# Patient Record
Sex: Male | Born: 1935 | Race: White | Hispanic: No | Smoking: Former smoker
Health system: Southern US, Community
[De-identification: ages and names within clinical notes are randomized; demographics above are authoritative.]

## PROBLEM LIST (undated history)

## (undated) DIAGNOSIS — E079 Disorder of thyroid, unspecified: Secondary | ICD-10-CM

## (undated) DIAGNOSIS — S91302A Unspecified open wound, left foot, initial encounter: Secondary | ICD-10-CM

## (undated) DIAGNOSIS — IMO0001 Reserved for inherently not codable concepts without codable children: Secondary | ICD-10-CM

## (undated) DIAGNOSIS — D649 Anemia, unspecified: Secondary | ICD-10-CM

## (undated) DIAGNOSIS — R198 Other specified symptoms and signs involving the digestive system and abdomen: Secondary | ICD-10-CM

## (undated) DIAGNOSIS — I10 Essential (primary) hypertension: Secondary | ICD-10-CM

## (undated) DIAGNOSIS — H9190 Unspecified hearing loss, unspecified ear: Secondary | ICD-10-CM

## (undated) DIAGNOSIS — R6889 Other general symptoms and signs: Secondary | ICD-10-CM

## (undated) DIAGNOSIS — H3530 Unspecified macular degeneration: Secondary | ICD-10-CM

## (undated) HISTORY — DX: Reserved for inherently not codable concepts without codable children: IMO0001

## (undated) HISTORY — DX: Disorder of thyroid, unspecified: E07.9

## (undated) HISTORY — DX: Unspecified macular degeneration: H35.30

## (undated) HISTORY — DX: Unspecified open wound, left foot, initial encounter: S91.302A

## (undated) HISTORY — DX: Essential (primary) hypertension: I10

## (undated) HISTORY — PX: CATARACT EXTRACTION: SUR2

## (undated) HISTORY — PX: OTHER SURGICAL HISTORY: SHX169

## (undated) HISTORY — DX: Other general symptoms and signs: R68.89

## (undated) HISTORY — PX: CORONARY ARTERY BYPASS GRAFT: SHX141

## (undated) HISTORY — DX: Other specified symptoms and signs involving the digestive system and abdomen: R19.8

## (undated) HISTORY — DX: Unspecified hearing loss, unspecified ear: H91.90

## (undated) HISTORY — DX: Anemia, unspecified: D64.9

## (undated) HISTORY — PX: ESOPHAGEAL DILATION: SHX303

---

## 1994-01-20 HISTORY — PX: LUMBAR LAMINECTOMY: SHX95

## 1996-01-21 HISTORY — PX: INGUINAL HERNIA REPAIR: SUR1180

## 1997-05-26 ENCOUNTER — Encounter: Admission: RE | Admit: 1997-05-26 | Discharge: 1997-08-24 | Payer: Self-pay | Admitting: Internal Medicine

## 1999-01-22 ENCOUNTER — Ambulatory Visit (HOSPITAL_COMMUNITY): Admission: RE | Admit: 1999-01-22 | Discharge: 1999-01-23 | Payer: Self-pay | Admitting: Cardiovascular Disease

## 1999-04-13 ENCOUNTER — Inpatient Hospital Stay (HOSPITAL_COMMUNITY): Admission: RE | Admit: 1999-04-13 | Discharge: 1999-04-21 | Payer: Self-pay | Admitting: Cardiology

## 1999-04-13 ENCOUNTER — Encounter: Payer: Self-pay | Admitting: Cardiothoracic Surgery

## 1999-04-16 ENCOUNTER — Encounter: Payer: Self-pay | Admitting: Cardiothoracic Surgery

## 1999-04-17 ENCOUNTER — Encounter: Payer: Self-pay | Admitting: Cardiothoracic Surgery

## 1999-04-18 ENCOUNTER — Encounter: Payer: Self-pay | Admitting: Cardiothoracic Surgery

## 1999-04-19 ENCOUNTER — Encounter: Payer: Self-pay | Admitting: Cardiothoracic Surgery

## 2004-04-10 ENCOUNTER — Ambulatory Visit: Payer: Self-pay | Admitting: Internal Medicine

## 2004-07-12 ENCOUNTER — Ambulatory Visit: Payer: Self-pay | Admitting: Internal Medicine

## 2004-07-16 ENCOUNTER — Ambulatory Visit: Payer: Self-pay | Admitting: Internal Medicine

## 2004-10-18 ENCOUNTER — Ambulatory Visit: Payer: Self-pay | Admitting: Internal Medicine

## 2004-11-07 ENCOUNTER — Ambulatory Visit (HOSPITAL_COMMUNITY): Admission: RE | Admit: 2004-11-07 | Discharge: 2004-11-08 | Payer: Self-pay | Admitting: Otolaryngology

## 2005-01-03 ENCOUNTER — Ambulatory Visit: Payer: Self-pay | Admitting: Internal Medicine

## 2005-04-15 ENCOUNTER — Ambulatory Visit: Payer: Self-pay | Admitting: Internal Medicine

## 2005-07-21 ENCOUNTER — Ambulatory Visit: Payer: Self-pay | Admitting: Internal Medicine

## 2005-11-24 ENCOUNTER — Ambulatory Visit: Payer: Self-pay | Admitting: Internal Medicine

## 2005-12-08 ENCOUNTER — Ambulatory Visit: Payer: Self-pay | Admitting: Internal Medicine

## 2006-05-25 ENCOUNTER — Ambulatory Visit: Payer: Self-pay | Admitting: Internal Medicine

## 2006-05-25 LAB — CONVERTED CEMR LAB
ALT: 19 units/L (ref 0–40)
AST: 24 units/L (ref 0–37)
BUN: 12 mg/dL (ref 6–23)
Cholesterol: 122 mg/dL (ref 0–200)
Creatinine, Ser: 1.1 mg/dL (ref 0.4–1.5)
HDL: 54.8 mg/dL (ref >39.0)
Hgb A1c MFr Bld: 6.1 % — ABNORMAL HIGH (ref 4.6–6.0)
LDL Cholesterol: 58 mg/dL (ref 0–99)
PSA: 0.46 ng/mL (ref 0.10–4.00)
Potassium: 4.7 meq/L (ref 3.5–5.1)
Total CHOL/HDL Ratio: 2.2
Triglycerides: 46 mg/dL (ref 0–149)
VLDL: 9 mg/dL (ref 0–40)

## 2006-06-02 ENCOUNTER — Encounter: Payer: Self-pay | Admitting: Internal Medicine

## 2006-06-02 ENCOUNTER — Ambulatory Visit: Payer: Self-pay | Admitting: Internal Medicine

## 2006-06-16 ENCOUNTER — Ambulatory Visit: Payer: Self-pay | Admitting: Internal Medicine

## 2006-06-18 DIAGNOSIS — I10 Essential (primary) hypertension: Secondary | ICD-10-CM | POA: Insufficient documentation

## 2006-07-07 ENCOUNTER — Ambulatory Visit: Payer: Self-pay | Admitting: Gastroenterology

## 2006-07-07 LAB — CONVERTED CEMR LAB
ALT: 19 units/L (ref 0–40)
Albumin: 3.8 g/dL (ref 3.5–5.2)
Alkaline Phosphatase: 53 units/L (ref 39–117)
BUN: 8 mg/dL (ref 6–23)
Basophils Absolute: 0 10*3/uL (ref 0.0–0.1)
Basophils Relative: 0.5 % (ref 0.0–1.0)
Calcium: 8.6 mg/dL (ref 8.4–10.5)
Creatinine, Ser: 0.9 mg/dL (ref 0.4–1.5)
GFR calc Af Amer: 105 mL/min
Hemoglobin: 11.3 g/dL — ABNORMAL LOW (ref 13.0–17.0)
MCHC: 33.4 g/dL (ref 30.0–36.0)
Monocytes Absolute: 0.4 10*3/uL (ref 0.2–0.7)
Monocytes Relative: 9.6 % (ref 3.0–11.0)
Platelets: 154 10*3/uL (ref 150–400)
Potassium: 4.5 meq/L (ref 3.5–5.1)
RDW: 13.6 % (ref 11.5–14.6)
Total Bilirubin: 0.6 mg/dL (ref 0.3–1.2)

## 2006-07-20 ENCOUNTER — Ambulatory Visit: Payer: Self-pay | Admitting: Gastroenterology

## 2006-07-20 LAB — CONVERTED CEMR LAB
Ferritin: 27.1 ng/mL
Folate: 18.3 ng/mL
Iron: 79 ug/dL
Saturation Ratios: 22.9 %
Transferrin: 246.7 mg/dL
Vitamin B-12: 387 pg/mL

## 2006-08-12 ENCOUNTER — Encounter: Payer: Self-pay | Admitting: Internal Medicine

## 2006-08-12 ENCOUNTER — Ambulatory Visit: Payer: Self-pay | Admitting: Gastroenterology

## 2006-08-12 DIAGNOSIS — K2990 Gastroduodenitis, unspecified, without bleeding: Secondary | ICD-10-CM

## 2006-08-12 DIAGNOSIS — K2970 Gastritis, unspecified, without bleeding: Secondary | ICD-10-CM | POA: Insufficient documentation

## 2006-08-12 DIAGNOSIS — K449 Diaphragmatic hernia without obstruction or gangrene: Secondary | ICD-10-CM | POA: Insufficient documentation

## 2006-08-19 ENCOUNTER — Ambulatory Visit: Payer: Self-pay | Admitting: Internal Medicine

## 2006-08-28 ENCOUNTER — Ambulatory Visit: Payer: Self-pay | Admitting: Internal Medicine

## 2006-09-17 ENCOUNTER — Ambulatory Visit: Payer: Self-pay | Admitting: Gastroenterology

## 2007-01-21 HISTORY — PX: COLONOSCOPY W/ POLYPECTOMY: SHX1380

## 2007-03-18 ENCOUNTER — Telehealth (INDEPENDENT_AMBULATORY_CARE_PROVIDER_SITE_OTHER): Payer: Self-pay | Admitting: *Deleted

## 2007-04-16 DIAGNOSIS — I7090 Unspecified atherosclerosis: Secondary | ICD-10-CM | POA: Insufficient documentation

## 2007-04-16 DIAGNOSIS — Z8711 Personal history of peptic ulcer disease: Secondary | ICD-10-CM | POA: Insufficient documentation

## 2007-04-16 DIAGNOSIS — K219 Gastro-esophageal reflux disease without esophagitis: Secondary | ICD-10-CM | POA: Insufficient documentation

## 2007-04-16 DIAGNOSIS — M129 Arthropathy, unspecified: Secondary | ICD-10-CM | POA: Insufficient documentation

## 2007-05-10 ENCOUNTER — Encounter: Payer: Self-pay | Admitting: Internal Medicine

## 2007-05-10 ENCOUNTER — Ambulatory Visit: Payer: Self-pay | Admitting: Internal Medicine

## 2007-05-10 DIAGNOSIS — E785 Hyperlipidemia, unspecified: Secondary | ICD-10-CM | POA: Insufficient documentation

## 2007-05-10 LAB — CONVERTED CEMR LAB
ALT: 13 units/L (ref 0–53)
AST: 22 units/L (ref 0–37)
Basophils Relative: 1.1 % — ABNORMAL HIGH (ref 0.0–1.0)
Creatinine, Ser: 0.9 mg/dL (ref 0.4–1.5)
Eosinophils Relative: 1.4 % (ref 0.0–5.0)
Hemoglobin: 12.2 g/dL — ABNORMAL LOW (ref 13.0–17.0)
Hgb A1c MFr Bld: 6.1 % — ABNORMAL HIGH (ref 4.6–6.0)
LDL Cholesterol: 73 mg/dL (ref 0–99)
Lymphocytes Relative: 31.3 % (ref 12.0–46.0)
Monocytes Relative: 8.1 % (ref 3.0–12.0)
Neutro Abs: 2.3 10*3/uL (ref 1.4–7.7)
PSA: 0.51 ng/mL (ref 0.10–4.00)
RBC: 3.95 M/uL — ABNORMAL LOW (ref 4.22–5.81)
Total CHOL/HDL Ratio: 2.4

## 2007-05-11 ENCOUNTER — Encounter (INDEPENDENT_AMBULATORY_CARE_PROVIDER_SITE_OTHER): Payer: Self-pay | Admitting: *Deleted

## 2007-05-18 ENCOUNTER — Ambulatory Visit: Payer: Self-pay | Admitting: Internal Medicine

## 2007-05-18 DIAGNOSIS — K222 Esophageal obstruction: Secondary | ICD-10-CM

## 2007-05-18 LAB — CONVERTED CEMR LAB: Cholesterol, target level: 200 mg/dL

## 2007-05-28 ENCOUNTER — Ambulatory Visit: Payer: Self-pay | Admitting: Gastroenterology

## 2007-05-28 LAB — CONVERTED CEMR LAB
Eosinophils Relative: 1.2 % (ref 0.0–5.0)
Ferritin: 24.2 ng/mL (ref 22.0–322.0)
HCT: 35.4 % — ABNORMAL LOW (ref 39.0–52.0)
Hemoglobin: 12.2 g/dL — ABNORMAL LOW (ref 13.0–17.0)
Iron: 63 ug/dL (ref 42–165)
Lymphocytes Relative: 36.3 % (ref 12.0–46.0)
Monocytes Absolute: 0.5 10*3/uL (ref 0.1–1.0)
Monocytes Relative: 8.5 % (ref 3.0–12.0)
Neutro Abs: 3.1 10*3/uL (ref 1.4–7.7)
Platelets: 176 10*3/uL (ref 150–400)
RDW: 13.4 % (ref 11.5–14.6)
Transferrin: 301.7 mg/dL (ref >=212.0)
WBC: 5.8 10*3/uL (ref 4.5–10.5)

## 2007-05-31 ENCOUNTER — Encounter: Payer: Self-pay | Admitting: Gastroenterology

## 2007-05-31 ENCOUNTER — Ambulatory Visit: Payer: Self-pay | Admitting: Gastroenterology

## 2007-06-02 ENCOUNTER — Encounter: Payer: Self-pay | Admitting: Gastroenterology

## 2007-06-16 ENCOUNTER — Encounter: Payer: Self-pay | Admitting: Internal Medicine

## 2007-11-18 ENCOUNTER — Ambulatory Visit: Payer: Self-pay | Admitting: Internal Medicine

## 2007-11-27 LAB — CONVERTED CEMR LAB
Basophils Relative: 0.5 % (ref 0.0–3.0)
Eosinophils Relative: 1 % (ref 0.0–5.0)
MCV: 89.8 fL (ref 78.0–100.0)
Monocytes Relative: 9.6 % (ref 3.0–12.0)
Neutrophils Relative %: 56 % (ref 43.0–77.0)
Platelets: 143 10*3/uL — ABNORMAL LOW (ref 150–400)
RBC: 3.89 M/uL — ABNORMAL LOW (ref 4.22–5.81)
WBC: 4.3 10*3/uL — ABNORMAL LOW (ref 4.5–10.5)

## 2007-11-29 ENCOUNTER — Telehealth (INDEPENDENT_AMBULATORY_CARE_PROVIDER_SITE_OTHER): Payer: Self-pay | Admitting: *Deleted

## 2007-11-29 ENCOUNTER — Encounter (INDEPENDENT_AMBULATORY_CARE_PROVIDER_SITE_OTHER): Payer: Self-pay | Admitting: *Deleted

## 2008-04-19 ENCOUNTER — Telehealth (INDEPENDENT_AMBULATORY_CARE_PROVIDER_SITE_OTHER): Payer: Self-pay | Admitting: *Deleted

## 2008-04-19 ENCOUNTER — Ambulatory Visit: Payer: Self-pay | Admitting: Family Medicine

## 2008-05-23 ENCOUNTER — Telehealth (INDEPENDENT_AMBULATORY_CARE_PROVIDER_SITE_OTHER): Payer: Self-pay | Admitting: *Deleted

## 2008-05-25 ENCOUNTER — Ambulatory Visit: Payer: Self-pay | Admitting: Internal Medicine

## 2008-05-26 ENCOUNTER — Encounter (INDEPENDENT_AMBULATORY_CARE_PROVIDER_SITE_OTHER): Payer: Self-pay | Admitting: *Deleted

## 2008-05-26 ENCOUNTER — Telehealth (INDEPENDENT_AMBULATORY_CARE_PROVIDER_SITE_OTHER): Payer: Self-pay | Admitting: *Deleted

## 2008-05-26 LAB — CONVERTED CEMR LAB
ALT: 15 units/L (ref 0–53)
AST: 20 units/L (ref 0–37)
Albumin: 4 g/dL (ref 3.5–5.2)
Basophils Relative: 0.5 % (ref 0.0–3.0)
Cholesterol: 126 mg/dL (ref 0–200)
Eosinophils Relative: 0.8 % (ref 0.0–5.0)
HDL: 54.6 mg/dL (ref >39.00)
Hgb A1c MFr Bld: 6.1 % (ref 4.6–6.5)
Iron: 60 ug/dL (ref 42–165)
Lymphocytes Relative: 20.4 % (ref 12.0–46.0)
MCV: 90.9 fL (ref 78.0–100.0)
Monocytes Absolute: 0.4 10*3/uL (ref 0.1–1.0)
Monocytes Relative: 7.5 % (ref 3.0–12.0)
Neutrophils Relative %: 70.8 % (ref 43.0–77.0)
PSA: 0.52 ng/mL (ref 0.10–4.00)
Platelets: 140 10*3/uL — ABNORMAL LOW (ref 150.0–400.0)
RBC: 3.9 M/uL — ABNORMAL LOW (ref 4.22–5.81)
TSH: 2.5 microintl units/mL (ref 0.35–5.50)
Total Protein: 7.3 g/dL (ref 6.0–8.3)
Transferrin: 258.7 mg/dL (ref 212.0–360.0)
Triglycerides: 55 mg/dL (ref 0.0–149.0)
VLDL: 11 mg/dL (ref 0.0–40.0)
Vitamin B-12: 438 pg/mL (ref 211–911)
WBC: 5.5 10*3/uL (ref 4.5–10.5)

## 2008-06-01 ENCOUNTER — Ambulatory Visit: Payer: Self-pay | Admitting: Internal Medicine

## 2008-06-01 DIAGNOSIS — Z860100 Personal history of colon polyps, unspecified: Secondary | ICD-10-CM | POA: Insufficient documentation

## 2008-06-01 DIAGNOSIS — I4949 Other premature depolarization: Secondary | ICD-10-CM | POA: Insufficient documentation

## 2008-06-01 DIAGNOSIS — E1159 Type 2 diabetes mellitus with other circulatory complications: Secondary | ICD-10-CM

## 2008-06-01 DIAGNOSIS — D649 Anemia, unspecified: Secondary | ICD-10-CM

## 2008-06-28 ENCOUNTER — Encounter: Payer: Self-pay | Admitting: Internal Medicine

## 2008-07-23 ENCOUNTER — Emergency Department (HOSPITAL_COMMUNITY): Admission: EM | Admit: 2008-07-23 | Discharge: 2008-07-23 | Payer: Self-pay | Admitting: Emergency Medicine

## 2008-08-02 ENCOUNTER — Telehealth (INDEPENDENT_AMBULATORY_CARE_PROVIDER_SITE_OTHER): Payer: Self-pay | Admitting: *Deleted

## 2008-12-29 ENCOUNTER — Encounter: Payer: Self-pay | Admitting: Internal Medicine

## 2009-05-09 ENCOUNTER — Telehealth (INDEPENDENT_AMBULATORY_CARE_PROVIDER_SITE_OTHER): Payer: Self-pay | Admitting: *Deleted

## 2009-05-15 ENCOUNTER — Ambulatory Visit: Payer: Self-pay | Admitting: Internal Medicine

## 2009-05-21 ENCOUNTER — Telehealth (INDEPENDENT_AMBULATORY_CARE_PROVIDER_SITE_OTHER): Payer: Self-pay | Admitting: *Deleted

## 2009-05-21 LAB — CONVERTED CEMR LAB
Albumin: 4.2 g/dL (ref 3.5–5.2)
Alkaline Phosphatase: 52 units/L (ref 39–117)
Basophils Relative: 0.1 % (ref 0.0–3.0)
CO2: 31 meq/L (ref 19–32)
Chloride: 105 meq/L (ref 96–112)
Eosinophils Absolute: 0 10*3/uL (ref 0.0–0.7)
Glucose, Bld: 106 mg/dL — ABNORMAL HIGH (ref 70–99)
HDL: 64.1 mg/dL (ref >39.00)
Hemoglobin: 11.6 g/dL — ABNORMAL LOW (ref 13.0–17.0)
LDL Cholesterol: 60 mg/dL (ref 0–99)
Lymphs Abs: 1.6 10*3/uL (ref 0.7–4.0)
MCHC: 33 g/dL (ref 30.0–36.0)
MCV: 92.2 fL (ref 78.0–100.0)
Monocytes Absolute: 0.5 10*3/uL (ref 0.1–1.0)
Neutro Abs: 3.6 10*3/uL (ref 1.4–7.7)
RBC: 3.79 M/uL — ABNORMAL LOW (ref 4.22–5.81)
Sodium: 144 meq/L (ref 135–145)
Total Bilirubin: 0.5 mg/dL (ref 0.3–1.2)
Total CHOL/HDL Ratio: 2
Triglycerides: 55 mg/dL (ref 0.0–149.0)

## 2009-05-28 LAB — CONVERTED CEMR LAB
Hgb A1c MFr Bld: 6.1 % (ref 4.6–6.5)
Saturation Ratios: 10.8 % — ABNORMAL LOW (ref 20.0–50.0)

## 2009-06-05 ENCOUNTER — Ambulatory Visit: Payer: Self-pay | Admitting: Internal Medicine

## 2009-06-05 DIAGNOSIS — J309 Allergic rhinitis, unspecified: Secondary | ICD-10-CM | POA: Insufficient documentation

## 2009-06-26 ENCOUNTER — Encounter: Payer: Self-pay | Admitting: Internal Medicine

## 2009-07-25 ENCOUNTER — Encounter: Payer: Self-pay | Admitting: Internal Medicine

## 2009-08-30 ENCOUNTER — Telehealth (INDEPENDENT_AMBULATORY_CARE_PROVIDER_SITE_OTHER): Payer: Self-pay | Admitting: *Deleted

## 2009-09-03 ENCOUNTER — Ambulatory Visit: Payer: Self-pay | Admitting: Internal Medicine

## 2009-09-05 LAB — CONVERTED CEMR LAB
AST: 21 units/L (ref 0–37)
Albumin: 4 g/dL (ref 3.5–5.2)
BUN: 10 mg/dL (ref 6–23)
Basophils Relative: 0.2 % (ref 0.0–3.0)
Cholesterol: 145 mg/dL (ref 0–200)
Creatinine, Ser: 0.9 mg/dL (ref 0.4–1.5)
Eosinophils Absolute: 0 10*3/uL (ref 0.0–0.7)
HDL: 40.8 mg/dL (ref >39.00)
MCHC: 33.7 g/dL (ref 30.0–36.0)
MCV: 90.9 fL (ref 78.0–100.0)
Monocytes Absolute: 0.3 10*3/uL (ref 0.1–1.0)
Neutrophils Relative %: 74.9 % (ref 43.0–77.0)
Potassium: 4.1 meq/L (ref 3.5–5.1)
RBC: 4.08 M/uL — ABNORMAL LOW (ref 4.22–5.81)
VLDL: 19 mg/dL (ref 0.0–40.0)

## 2009-09-10 ENCOUNTER — Ambulatory Visit: Payer: Self-pay | Admitting: Internal Medicine

## 2009-09-10 LAB — CONVERTED CEMR LAB: OCCULT 1: NEGATIVE

## 2009-09-11 ENCOUNTER — Telehealth (INDEPENDENT_AMBULATORY_CARE_PROVIDER_SITE_OTHER): Payer: Self-pay | Admitting: *Deleted

## 2009-09-11 ENCOUNTER — Encounter (INDEPENDENT_AMBULATORY_CARE_PROVIDER_SITE_OTHER): Payer: Self-pay | Admitting: *Deleted

## 2009-11-22 ENCOUNTER — Telehealth (INDEPENDENT_AMBULATORY_CARE_PROVIDER_SITE_OTHER): Payer: Self-pay | Admitting: *Deleted

## 2009-12-11 ENCOUNTER — Ambulatory Visit: Payer: Self-pay | Admitting: Internal Medicine

## 2009-12-17 LAB — CONVERTED CEMR LAB
Eosinophils Absolute: 0 10*3/uL (ref 0.0–0.7)
Eosinophils Relative: 1 % (ref 0.0–5.0)
HCT: 35.5 % — ABNORMAL LOW (ref 39.0–52.0)
Hgb A1c MFr Bld: 6.3 % (ref 4.6–6.5)
Lymphs Abs: 1.4 10*3/uL (ref 0.7–4.0)
MCHC: 33.9 g/dL (ref 30.0–36.0)
MCV: 90.4 fL (ref 78.0–100.0)
Microalb, Ur: 0.7 mg/dL (ref 0.0–1.9)
Monocytes Absolute: 0.5 10*3/uL (ref 0.1–1.0)
Neutrophils Relative %: 53.4 % (ref 43.0–77.0)
Platelets: 199 10*3/uL (ref 150.0–400.0)

## 2009-12-18 ENCOUNTER — Ambulatory Visit: Payer: Self-pay | Admitting: Internal Medicine

## 2009-12-18 DIAGNOSIS — I499 Cardiac arrhythmia, unspecified: Secondary | ICD-10-CM | POA: Insufficient documentation

## 2009-12-19 ENCOUNTER — Encounter: Payer: Self-pay | Admitting: Internal Medicine

## 2010-02-11 ENCOUNTER — Telehealth (INDEPENDENT_AMBULATORY_CARE_PROVIDER_SITE_OTHER): Payer: Self-pay | Admitting: *Deleted

## 2010-02-21 NOTE — Progress Notes (Signed)
Summary: Refill Request  Phone Note Refill Request Call back at [phone] Message from:  Pharmacy on September 11, 2009 4:02 PM  Refills Requested: Medication #1:  METFORMIN HCL 1000 MG  TABS 1/2 once daily (A1c 6.1%)   Dosage confirmed as above?Dosage Confirmed   Supply Requested: 3 months [facility]  Next Appointment Scheduled: 12/11/09 Initial call taken by: Lavell Islam,  September 11, 2009 4:02 PM    Prescriptions: METFORMIN HCL 1000 MG  TABS (METFORMIN HCL) 1/2 once daily (A1c 6.1%)  #45 x 1   Entered by:   Jeremy Johann CMA   Authorized by:   Marga Melnick MD   Signed by:   Jeremy Johann CMA on 09/11/2009   Method used:   Printed then faxed to ...       [facility] (mail-order)       [address], [address], [place]  [address]       Ph: [phone]       Fax: [phone]   RxID:   [id number]

## 2010-02-21 NOTE — Progress Notes (Signed)
Summary: Refill Request/Appointment Due  Phone Note Refill Request Message from:  Pharmacy on [place] Fax #: [phone]  Refills Requested: Medication #1:  ACTOS 15 MG  TABS take 1 tab once daily   Dosage confirmed as above?Dosage Confirmed   Supply Requested: 3 months  Medication #2:  METFORMIN HCL 1000 MG  TABS 1/2 bid   Supply Requested: 3 months New prescritption request  Next Appointment Scheduled: none Initial call taken by: Harold Barban,  May 09, 2009 8:28 AM  Follow-up for Phone Call        Patient was due for labs in 11/2008, please call to schedule.  This was copied and pasted from 06/01/2008 appointment:  A1c in 6 months (250.00) Follow-up by: Shonna Chock,  May 09, 2009 9:52 AM  Additional Follow-up for Phone Call Additional follow up Details #1::        Patient is coming in 4.26.11 and then in may for his physical.  Additional Follow-up by: Harold Barban,  May 09, 2009 11:15 AM    Prescriptions: ACTOS 15 MG  TABS (PIOGLITAZONE HCL) take 1 tab once daily  #90 x 0   Entered by:   Shonna Chock   Authorized by:   Marga Melnick MD   Signed by:   Shonna Chock on 05/09/2009   Method used:   Print then Give to Patient   RxID:   [id number] METFORMIN HCL 1000 MG  TABS (METFORMIN HCL) 1/2 bid  #90 x 0   Entered by:   Shonna Chock   Authorized by:   Marga Melnick MD   Signed by:   Shonna Chock on 05/09/2009   Method used:   Print then Give to Patient   RxID:   [id number]

## 2010-02-21 NOTE — Progress Notes (Signed)
Summary: Refill request  Phone Note Refill Request Message from:  Pharmacy  Refills Requested: Medication #1:  TOPROL XL 50 MG  TB24 qd  Medication #2:  PRAVASTATIN SODIUM 20 MG TABS 1 at bedtime [facility] [phone]   Method Requested: Fax to Mail Away Pharmacy Initial call taken by: Shonna Chock CMA,  November 22, 2009 10:18 AM    Prescriptions: TOPROL XL 50 MG  TB24 (METOPROLOL SUCCINATE) qd  #90 x 2   Entered by:   Shonna Chock CMA   Authorized by:   Marga Melnick MD   Signed by:   Shonna Chock CMA on 11/22/2009   Method used:   Faxed to ...       [facility] (mail-order)             , [place]         Ph: [phone]       Fax: [phone]   RxID:   [id number] PRAVASTATIN SODIUM 20 MG TABS (PRAVASTATIN SODIUM) 1 at bedtime  #90 x 2   Entered by:   Shonna Chock CMA   Authorized by:   Marga Melnick MD   Signed by:   Shonna Chock CMA on 11/22/2009   Method used:   Faxed to ...       [facility] (mail-order)             , [place]         Ph: [phone]       Fax: [phone]   RxID:   [id number]

## 2010-02-21 NOTE — Assessment & Plan Note (Signed)
Summary: emp//pt will be fasting//lch   Vital Signs:  Patient profile:   77 year old male Height:      63 inches Weight:      159.8 pounds BMI:     28.41 Temp:     98.1 degrees F oral Pulse rate:   60 / minute Resp:     14 per minute BP sitting:   112 / 68  (left arm) Cuff size:   regular  Vitals Entered By: Shonna Chock (Jun 05, 2009 10:39 AM) CC: Yearly follow-up: already had labs Comments REVIEWED MED LIST, PATIENT AGREED DOSE AND INSTRUCTION CORRECT    CC:  Yearly follow-up: already had labs.  History of Present Illness: He has had throat  congestion X 2 months; he had same symptoms in 2010 in Spring. Rx: Mucinex helps some. Scant sputum is white. No constitutional symptoms.He is not on an ACE-I. FBS 100-120; post meal high  not recorded. No hypoglycemic spells; weight stable. Lab results reviewed & risks discussed.A1c 6.1 %, goal  6.5-7%.Anemia is stable but iron is low normal @ 40.Colonoscopy 2009: adenomatous polyps.  Allergies (verified): No Known Drug Allergies  Review of Systems General:  Denies chills, fever, sweats, and weight loss. Eyes:  Complains of vision loss-1 eye; Macular Degeneration OD. ENT:  Complains of postnasal drainage; denies nasal congestion, nosebleeds, and sinus pressure; No frontal headaches, facial pain or purulence.. CV:  Denies chest pain or discomfort, difficulty breathing at night, difficulty breathing while lying down, lightheadness, near fainting, palpitations, shortness of breath with exertion, swelling of feet, and swelling of hands. Resp:  Denies chest pain with inspiration, coughing up blood, pleuritic, shortness of breath, and wheezing. GI:  Denies abdominal pain, bloody stools, dark tarry stools, and indigestion; Antacid controls dyspepsia. GU:  Denies hematuria. MS:  Complains of joint pain and low back pain; R hand pain; Rx: . Derm:  Denies poor wound healing. Neuro:  Denies numbness and tingling. Endo:  Denies excessive hunger and  excessive thirst. Heme:  Denies abnormal bruising and bleeding. Allergy:  Complains of itching eyes; denies sneezing.  Physical Exam  General:  Appears younger than age,well-nourished; alert,appropriate and cooperative throughout examination Neck:  No deformities, masses, or tenderness noted.Osteophyte on L Chest Wall:  barrel-chest deformity.   Lungs:  Normal respiratory effort, chest expands symmetrically. Lungs are clear to auscultation, no crackles or wheezes. Heart:  regular rhythm and bradycardia.  S4 Abdomen:  Bowel sounds positive,abdomen soft and non-tender without masses, organomegaly or hernias noted. Msk:  Asymmetry of  posterior thorax R > L Pulses:  R and L carotid,radial,dorsalis pedis and posterior tibial pulses are full and equal bilaterally Extremities:  No clubbing, cyanosis, edema. Good nail health Neurologic:  alert & oriented X3 and sensation intact to light touch over feet.   Skin:  Intact without suspicious lesions or rashes Cervical Nodes:  No lymphadenopathy noted Axillary Nodes:  No palpable lymphadenopathy Psych:  memory intact for recent and remote, normally interactive, and good eye contact.     Impression & Recommendations:  Problem # 1:  RHINITIS (ICD-477.9)  Problem # 2:  DIABETES MELLITUS, TYPE II (ICD-250.00) excellent control The following medications were removed from the medication list:    Actos 15 Mg Tabs (Pioglitazone hcl) .Marland Kitchen... 1/2 tab two times a day with 2 largest meals His updated medication list for this problem includes:    Aspirin 325 Mg Tabs (Aspirin)    Metformin Hcl 1000 Mg Tabs (Metformin hcl) .Marland Kitchen... 1/2 bid  Problem #  3:  ANEMIA-NOS (ICD-285.9) iron minimally reduced  Problem # 4:  COLONIC POLYPS, HX OF (ICD-V12.72) adenomatous polyps 2009  Problem # 5:  OTHER AND UNSPECIFIED HYPERLIPIDEMIA (ICD-272.4)  The following medications were removed from the medication list:    Advicor 1000-20 Mg Tb24 (Niacin-lovastatin) .....  Qd His updated medication list for this problem includes:    Pravastatin Sodium 20 Mg Tabs (Pravastatin sodium) .Marland Kitchen... 1 at bedtime  Orders: EKG w/ Interpretation (93[address])  Complete Medication List: 1)  Mvi  2)  Aspirin 325 Mg Tabs (Aspirin) 3)  Toprol Xl 50 Mg Tb24 (Metoprolol succinate) .... Qd 4)  Metformin Hcl 1000 Mg Tabs (Metformin hcl) .... 1/2 bid 5)  Omega-3 350 Mg Caps (Omega-3 fatty acids) 6)  Antacid 500 Mg Chew (Calcium carbonate antacid) 7)  Multivitamins Tabs (Multiple vitamin) .Marland Kitchen.. 1 by mouth once daily 8)  Mucinex Dm 30-600 Mg Xr12h-tab (Dextromethorphan-guaifenesin) .... 2 by mouth once daily 9)  Pravastatin Sodium 20 Mg Tabs (Pravastatin sodium) .Marland Kitchen.. 1 at bedtime 10)  Singulair 10 Mg Tabs (Montelukast sodium) .Marland Kitchen.. 1 once daily until allergic symptoms resolve  Patient Instructions: 1)  Please schedule a follow-up appointment in 3 months.Hepatic Panel prior to visit, ICD-9:995.20;Lipid Panel prior to visit, ICD-9:272.4;CBC w/ Diff prior to visit, ICD-9:285.9.;A1c prior to visit,ICD-9: 250.00.Complete stool cards. Use Neti device to clean sinuses once daily as needed . Prescriptions: SINGULAIR 10 MG TABS (MONTELUKAST SODIUM) 1 once daily until allergic symptoms resolve  #30 x 1   Entered and Authorized by:   Marga Melnick MD   Signed by:   Marga Melnick MD on 06/05/2009   Method used:   Print then Give to Patient   RxID:   [id number] PRAVASTATIN SODIUM 20 MG TABS (PRAVASTATIN SODIUM) 1 at bedtime  #90 x 1   Entered and Authorized by:   Marga Melnick MD   Signed by:   Marga Melnick MD on 06/05/2009   Method used:   Print then Give to Patient   RxID:   [id number] METFORMIN HCL 1000 MG  TABS (METFORMIN HCL) 1/2 bid  #90 x 0   Entered and Authorized by:   Marga Melnick MD   Signed by:   Marga Melnick MD on 06/05/2009   Method used:   Print then Give to Patient   RxID:   [id number] TOPROL XL 50 MG  TB24 (METOPROLOL SUCCINATE) qd  #90 x 3    Entered and Authorized by:   Marga Melnick MD   Signed by:   Marga Melnick MD on 06/05/2009   Method used:   Print then Give to Patient   RxID:   [id number]

## 2010-02-21 NOTE — Letter (Signed)
Summary: [facility]  [facility]   Imported By: Lanelle Bal 07/07/2009 11:25:00  _____________________________________________________________________  External Attachment:    Type:   Image     Comment:   External Document

## 2010-02-21 NOTE — Progress Notes (Signed)
Summary: refill  Phone Note Refill Request Message from:  Fax from Pharmacy on August 30, 2009 1:45 PM  Refills Requested: Medication #1:  METFORMIN HCL 1000 MG  TABS 1/2 bid [facility] [phone] --- include direction  Initial call taken by: Okey Regal [place],  August 30, 2009 1:47 PM  Follow-up for Phone Call        Faxed paper RX with instruction 1/2 by mouth two times a day  Follow-up by: Shonna Chock CMA,  August 31, 2009 10:14 AM

## 2010-02-21 NOTE — Letter (Signed)
Summary: Results Follow up Letter  [facility]  [address] [place], [place] [address]   Phone: [phone]  Fax: [phone]    09/11/2009 MRN: [id number]  KAIEA ESSELMAN [address] [place], [place]  [address]  Dear Mr. SANTILLI,  The following are the results of your recent test(s):  Test         Result    Pap Smear:        Normal _____  Not Normal _____ Comments: ______________________________________________________ Cholesterol: LDL(Bad cholesterol):         Your goal is less than:         HDL (Good cholesterol):       Your goal is more than: Comments:  ______________________________________________________ Mammogram:        Normal _____  Not Normal _____ Comments:  ___________________________________________________________________ Hemoccult:        Normal _X____  Not normal _______ Comments:    _____________________________________________________________________ Other Tests:    We routinely do not discuss normal results over the telephone.  If you desire a copy of the results, or you have any questions about this information we can discuss them at your next office visit.   Sincerely,

## 2010-02-21 NOTE — Progress Notes (Signed)
Summary: refill-hopp  Phone Note Refill Request Call back at fax [phone] Message from:  [facility]   Refills Requested: Medication #1:  ACTOS 15 MG  TABS take 1 tab once daily per  dr hopper #90 no refills and change med to 1/2 tab two times a day with 2 largest meals. rx form faxed back  Initial call taken by: [facility] CMA,  May 21, 2009 8:47 AM    New/Updated Medications: ACTOS 15 MG  TABS (PIOGLITAZONE HCL) 1/2 tab two times a day with 2 largest meals Prescriptions: ACTOS 15 MG  TABS (PIOGLITAZONE HCL) 1/2 tab two times a day with 2 largest meals  #90 x 0   Entered by:   Jeremy Johann CMA   Authorized by:   Marga Melnick MD   Signed by:   Jeremy Johann CMA on 05/21/2009   Method used:   Historical   RxID:   [id number]

## 2010-02-21 NOTE — Assessment & Plan Note (Signed)
Summary: rto 3 months/cbs   Vital Signs:  Patient profile:   77 year old male Weight:      162.4 pounds BMI:     28.87 Pulse rate:   56 / minute Resp:     15 per minute BP sitting:   104 / 68  (left arm) Cuff size:   regular  Vitals Entered By: Shonna Chock CMA (December 18, 2009 10:37 AM) CC: Follow-up visit: discuss labs (copy given) , Type 2 diabetes mellitus follow-up   CC:  Follow-up visit: discuss labs (copy given)  and Type 2 diabetes mellitus follow-up.  History of Present Illness: Type 2 Diabetes Mellitus Follow-Up      This is an 77 year old man who presents for Type 2 diabetes mellitus follow-up.  The patient reports weight gain of 2#, but denies polyuria, polydipsia, blurred vision, self managed hypoglycemia, and numbness of extremities.  Other symptoms include vision loss due to Macular Degeneration.  The patient denies the following symptoms: neuropathic pain, chest pain, vomiting, orthostatic symptoms, poor wound healing, intermittent claudication, and foot ulcer.  Since the last visit the patient reports good dietary compliance.  The patient has been measuring capillary blood glucose before breakfast, range 130-140.  Since the last visit, the patient reports having had eye care by an Ophthalmologist, no retinopathy.  A1c 6.3 % (average sugar 134, risk 26 %). He questioned allowing the A1c to rise ; we discussed the new A1c goal guidelines & focus on preventing any hypoglycemia  with  his documented co-morbidities(age, PMH of CAD, anemia).                                                                                                         Anema F/U: Hct 35.5 , stable since  05/2008. Iron panel , B12 & folate were normal then . WBC 4000 but normal differential. Platelet  # is normal. ROS is negative for bleeding dyscrasia.  Current Medications (verified): 1)  Mvi 2)  Aspirin 325 Mg  Tabs (Aspirin) 3)  Toprol Xl 50 Mg  Tb24 (Metoprolol Succinate) .... Qd 4)  Metformin Hcl  1000 Mg  Tabs (Metformin Hcl) .... 1/2 Once Daily (A1c 6.1%) 5)  Omega-3 350 Mg  Caps (Omega-3 Fatty Acids) 6)  Antacid 500 Mg  Chew (Calcium Carbonate Antacid) 7)  Multivitamins  Tabs (Multiple Vitamin) .Marland Kitchen.. 1 By Mouth Once Daily 8)  Pravastatin Sodium 20 Mg Tabs (Pravastatin Sodium) .Marland Kitchen.. 1 At Bedtime 9)  Onetouch Ultra Test  Strp (Glucose Blood) .... Check Blood Sugar 3 X Weekly, Dx: 250.00 10)  Onetouch Lancets  Misc (Lancets) .... Check Bloodsugar 3 X Weekly, Dx: 250.00 11)  Singulair 10 Mg Tabs (Montelukast Sodium) .Marland Kitchen.. 1 By Mouth Once Daily  Allergies (verified): No Known Drug Allergies  Review of Systems General:  Denies chills, fever, and sweats. ENT:  Complains of postnasal drainage; denies difficulty swallowing, hoarseness, and nosebleeds. Resp:  Denies coughing up blood. GI:  Denies abdominal pain, bloody stools, dark tarry stools, and indigestion; No dysphagia. GU:  Denies hematuria. Heme:  Denies abnormal bruising and bleeding.  Physical Exam  General:  well-nourished,appears younger than age alert,appropriate and cooperative throughout examination Lungs:  Normal respiratory effort, chest expands symmetrically. Lungs are clear to auscultation, no crackles or wheezes. Heart:  bradycardia and irregular rhythm.   Abdomen:  Bowel sounds positive,abdomen soft and non-tender without masses, organomegaly or hernias noted. Pulses:  R and L carotid,radial,dorsalis pedis and posterior tibial pulses are full and equal bilaterally Extremities:  No clubbing, cyanosis, edema. Neurologic:  alert & oriented X3.   Skin:  Intact without suspicious lesions or rashes Cervical Nodes:  No lymphadenopathy noted Axillary Nodes:  No palpable lymphadenopathy Psych:  memory intact for recent and remote, normally interactive, and good eye contact.     Impression & Recommendations:  Problem # 1:  DIABETES MELLITUS, TYPE II (ICD-250.00) exceent control His updated medication list for this  problem includes:    Aspirin 325 Mg Tabs (Aspirin)    Metformin Hcl 1000 Mg Tabs (Metformin hcl) .Marland Kitchen... 1/2 once daily (a1c 6.1%)  Problem # 2:  UNSPECIFIED CARDIAC DYSRHYTHMIA (ICD-427.9) R/O AF His updated medication list for this problem includes:    Aspirin 325 Mg Tabs (Aspirin)    Toprol Xl 50 Mg Tb24 (Metoprolol succinate) ..... Qd  Orders: EKG w/ Interpretation (93000)  Problem # 3:  RHINITIS (ICD-477.9)  Singulair costs $125/ month  His updated medication list for this problem includes:    Fluticasone Propionate 50 Mcg/act Susp (Fluticasone propionate) .Marland Kitchen... 1 spray two times a day as needed  Orders: Prescription Created Electronically [phone])  Problem # 4:  ANEMIA-NOS (ICD-285.9) stable  Complete Medication List: 1)  Mvi  2)  Aspirin 325 Mg Tabs (Aspirin) 3)  Toprol Xl 50 Mg Tb24 (Metoprolol succinate) .... Qd 4)  Metformin Hcl 1000 Mg Tabs (Metformin hcl) .... 1/2 once daily (a1c 6.1%) 5)  Omega-3 350 Mg Caps (Omega-3 fatty acids) 6)  Antacid 500 Mg Chew (Calcium carbonate antacid) 7)  Multivitamins Tabs (Multiple vitamin) .Marland Kitchen.. 1 by mouth once daily 8)  Pravastatin Sodium 20 Mg Tabs (Pravastatin sodium) .Marland Kitchen.. 1 at bedtime 9)  Onetouch Ultra Test Strp (Glucose blood) .... Check blood sugar 3 x weekly, dx: 250.00 10)  Onetouch Lancets Misc (Lancets) .... Check bloodsugar 3 x weekly, dx: 250.00 11)  Fluticasone Propionate 50 Mcg/act Susp (Fluticasone propionate) .Marland Kitchen.. 1 spray two times a day as needed  Patient Instructions: 1)  Neti pot once daily as needed for head congestion. Do NOT take decongestants ! Avoid other stimulants to excess as discussed.Report any heart rhythm abnormalities. 2)  Please schedule a follow-up appointment in 6 months. 3)  BUN, creat,K+ prior to visit, ICD-9: 4)  Hepatic Panel prior to visit, ICD-9: 5)  Lipid Panel prior to visit, ICD-9: 6)  HbgA1C prior to visit, ICD-9: 7)  CBC w/ Diff prior to visit, ICD-9: 8)  Check your blood sugars  regularly. If your readings are usually above :150  or below 90 you should contact our office.Report ANY low sugar episodes. Prescriptions: FLUTICASONE PROPIONATE 50 MCG/ACT SUSP (FLUTICASONE PROPIONATE) 1 spray two times a day as needed  #1 x 11   Entered and Authorized by:   Marga Melnick MD   Signed by:   Marga Melnick MD on 12/18/2009   Method used:   Faxed to ...       [id number] (retail)       [address]       [place], [place]  [address]  Ph: [phone]       Fax: [phone]   RxID:   [id number]    Orders Added: 1)  Est. Patient Level IV [55732] 2)  EKG w/ Interpretation [93000] 3)  Prescription Created Electronically [phone]

## 2010-02-21 NOTE — Assessment & Plan Note (Signed)
Summary: rto 3 months/cbs   Vital Signs:  Patient profile:   77 year old male Weight:      157.4 pounds Pulse rate:   56 / minute Resp:     17 per minute BP sitting:   124 / 78  (left arm) Cuff size:   regular  Vitals Entered By: Shonna Chock CMA (September 10, 2009 10:21 AM) CC: 3 month follow-up, patient with copy of labs. Patient requested rx for lancets and test strips sent to [facility], Type 2 diabetes mellitus follow-up   CC:  3 month follow-up, patient with copy of labs. Patient requested rx for lancets and test strips sent to [facility], and Type 2 diabetes mellitus follow-up.  History of Present Illness: Type 2 Diabetes Mellitus Follow-Up      This is an 77 year old man who presents for Type 2 diabetes mellitus follow-up.  The patient denies polyuria, polydipsia, blurred vision, self managed hypoglycemia, weight loss, weight gain, and numbness of extremities.  The patient denies the following symptoms: neuropathic pain, chest pain, vomiting, orthostatic symptoms, poor wound healing, intermittent claudication, vision loss, and foot ulcer.  Since the last visit the patient reports good dietary compliance, compliance with medications, and exercising regularly(walking 35 min daily).  The patient has been measuring capillary blood glucose before breakfast, range 110-120.  Since the last visit, the patient reports having had eye care by an ophthalmologist and no foot care. A1c 6.1%;he is reluctant to decrease Metformin as recommended.The  risk of HYPOglycemia discussed.                                                                                                                            Labs reviewed ; all values @ goal except for anemia. H/H 12.5 / 37. Colon polypectomy 2009; PMH of Esophageal Stricture.Stool cards pending. Negative GI ROS. No PMH of B12 deficiency.B12  & folate WNL in 04/2009. H/H in 04/ 2011 11.6/ 35.  Current Medications (verified): 1)  Mvi 2)  Aspirin 325 Mg  Tabs  (Aspirin) 3)  Toprol Xl 50 Mg  Tb24 (Metoprolol Succinate) .... Qd 4)  Metformin Hcl 1000 Mg  Tabs (Metformin Hcl) .... 1/2 Once Daily (A1c 6.1%) 5)  Omega-3 350 Mg  Caps (Omega-3 Fatty Acids) 6)  Antacid 500 Mg  Chew (Calcium Carbonate Antacid) 7)  Multivitamins  Tabs (Multiple Vitamin) .Marland Kitchen.. 1 By Mouth Once Daily 8)  Pravastatin Sodium 20 Mg Tabs (Pravastatin Sodium) .Marland Kitchen.. 1 At Bedtime 9)  Onetouch Ultra Test  Strp (Glucose Blood) .... Check Blood Sugar 3 X Weekly, Dx: 250.00 10)  Onetouch Lancets  Misc (Lancets) .... Check Bloodsugar 3 X Weekly, Dx: 250.00  Allergies (verified): No Known Drug Allergies  Past History:  Past Medical History: colonoscopy [phone] ,negative  except int hemorrhoids; duodenal ulcers in 1960s; H pylori gastritis , PMH of; esophageal stricture ,PMH of Hypertension Anemia-NOS Diabetes mellitus, type II GERD Colonic polyps, hx of 05/2007, Dr Jarold Motto  Past  Surgical History: CABG (2001) Lumbar Laminectomy (1996) Bilat. Inguinal Hernia (1998); Esophageal dilation X1; Surgery for Macular Degeneration; Cataract extraction OD Colon polypectomy 2009  Review of Systems General:  Complains of fatigue. ENT:  Denies difficulty swallowing, hoarseness, and nosebleeds. Resp:  Denies coughing up blood. GI:  Denies abdominal pain, bloody stools, dark tarry stools, and indigestion. GU:  Denies hematuria. Heme:  Denies abnormal bruising and bleeding.  Physical Exam  General:  Appears much younger than age;well-nourished; alert,appropriate and cooperative throughout examination Lungs:  Normal respiratory effort, chest expands symmetrically. Lungs are clear to auscultation, no crackles or wheezes. Heart:  no murmur, bradycardia, andslightly  irregular rhythm.   Abdomen:  Bowel sounds positive,abdomen soft and non-tender without masses, organomegaly or hernias noted.Scaphoid abdomen Pulses:  R and L carotid,radial,dorsalis pedis and posterior tibial pulses are full  and equal bilaterally Extremities:  No clubbing, cyanosis, edema. Good nail health Neurologic:  alert & oriented X3.   Skin:  Intact without suspicious lesions or rashes Psych:  memory intact for recent and remote and normally interactive.  Focused & motivated   Impression & Recommendations:  Problem # 1:  DIABETES MELLITUS, TYPE II (ICD-250.00) incredibly good control; risk for HYPOglycemia His updated medication list for this problem includes:    Aspirin 325 Mg Tabs (Aspirin)    Metformin Hcl 1000 Mg Tabs (Metformin hcl) .Marland Kitchen... 1/2 once daily (a1c 6.1%)  Problem # 2:  ANEMIA-NOS (ICD-285.9) Stable to improved  Complete Medication List: 1)  Mvi  2)  Aspirin 325 Mg Tabs (Aspirin) 3)  Toprol Xl 50 Mg Tb24 (Metoprolol succinate) .... Qd 4)  Metformin Hcl 1000 Mg Tabs (Metformin hcl) .... 1/2 once daily (a1c 6.1%) 5)  Omega-3 350 Mg Caps (Omega-3 fatty acids) 6)  Antacid 500 Mg Chew (Calcium carbonate antacid) 7)  Multivitamins Tabs (Multiple vitamin) .Marland Kitchen.. 1 by mouth once daily 8)  Pravastatin Sodium 20 Mg Tabs (Pravastatin sodium) .Marland Kitchen.. 1 at bedtime 9)  Onetouch Ultra Test Strp (Glucose blood) .... Check blood sugar 3 x weekly, dx: 250.00 10)  Onetouch Lancets Misc (Lancets) .... Check bloodsugar 3 x weekly, dx: 250.00  Patient Instructions: 1)  Please schedule a follow-up Lab  appointment in 3 months. 2)  CBC w/ Diff iron panel, ICD-9:285.9 3)  HbgA1Ct, ICD-9:250.00 4)  Urine Microalbumin , ICD-9:250.00. 5)  Check your blood sugars regularly. If your readings are usually above : 150 or below 90 you should contact our office. Prescriptions: ONETOUCH LANCETS  MISC (LANCETS) check bloodsugar 3 x weekly, DX: 250.00  #5mo supple x 3   Entered by:   Shonna Chock CMA   Authorized by:   Marga Melnick MD   Signed by:   Shonna Chock CMA on 09/10/2009   Method used:   Electronically to        [facility]* (mail-order)       [address], [address] [place], [place]  [address]       Ph: [phone]       Fax: [phone]   RxID:   [id number] ONETOUCH ULTRA TEST  STRP (GLUCOSE BLOOD) check blood sugar 3 x weekly, DX: 250.00  #50 x 3   Entered by:   Shonna Chock CMA   Authorized by:   Marga Melnick MD   Signed by:   Shonna Chock CMA on 09/10/2009   Method used:   Electronically to        [facility]* (mail-order)       [address], [address], [place]  [address]       Ph: [phone]       Fax: [phone]   RxID:   [id number]

## 2010-02-21 NOTE — Progress Notes (Signed)
Summary: strips and lancets refill--now going to [facility] Note Refill Request Call back at Home Phone [phone] Message from:  Patient on February 11, 2010 1:45 PM  Refills Requested: Medication #1:  ONETOUCH ULTRA TEST  STRP check blood sugar 3 x weekly  Medication #2:  ONETOUCH LANCETS  MISC check bloodsugar 3 x weekly patient says BCBS wont pay for prescriptions to go to [facility] says these prescriptions have to go to [facility]  Next Appointment Scheduled: lab =5/29, [place] = 6/4 Initial call taken by: Jerolyn Shin,  February 11, 2010 1:48 PM    Prescriptions: ONETOUCH ULTRA TEST  STRP (GLUCOSE BLOOD) check blood sugar 3 x weekly, DX: 250.00  #4mo supp x 3   Entered by:   Shonna Chock CMA   Authorized by:   Marga Melnick MD   Signed by:   Shonna Chock CMA on 02/12/2010   Method used:   Faxed to ...       [facility] (mail-order)       [address] [place], [place]  [address]       Ph: [phone]       Fax: [phone]   RxID:   [id number] ONETOUCH LANCETS  MISC (LANCETS) check bloodsugar 3 x weekly, DX: 250.00  #30mo supp x 2   Entered by:   Shonna Chock CMA   Authorized by:   Marga Melnick MD   Signed by:   Shonna Chock CMA on 02/12/2010   Method used:   Faxed to ...       [facility] (mail-order)       [address] [place], [place]  [address]       Ph: [phone]       Fax: [phone]   RxID:   [id number]

## 2010-02-21 NOTE — Medication Information (Signed)
Summary: [facility]lies[facility]  [facility]lies/[facility]   Imported By: Lanelle Bal 08/02/2009 14:07:18  _____________________________________________________________________  External Attachment:    Type:   Image     Comment:   External Document

## 2010-02-28 ENCOUNTER — Telehealth: Payer: Self-pay | Admitting: Internal Medicine

## 2010-03-04 ENCOUNTER — Encounter: Payer: Self-pay | Admitting: Internal Medicine

## 2010-03-07 NOTE — Progress Notes (Signed)
Summary: Pravastatin future refill  Phone Note Refill Request Message from:  Fax from Pharmacy on February 28, 2010 2:47 PM  Refills Requested: Medication #1:  PRAVASTATIN SODIUM 20 MG TABS 1 at bedtime [facility], [address], [place], [place]  phone - [phone]    fax - [phone]    qty-  [phone]       NOTE****Note on Fax from [facility] states that this medication was recently filled by mail order---pt switching back to [place] Sharl Ma) from mail order.  Just need this one to put on file for next time  Next Appointment Scheduled: 5/29  lab;    6/4   [place] Initial call taken by: Jerolyn Shin,  February 28, 2010 2:52 PM    Prescriptions: PRAVASTATIN SODIUM 20 MG TABS (PRAVASTATIN SODIUM) 1 at bedtime  #90 x 0   Entered by:   Lucious Groves CMA   Authorized by:   Marga Melnick MD   Signed by:   Lucious Groves CMA on 02/28/2010   Method used:   Electronically to        [id number]* (retail)       [address] [place] [address]       [place], [place]  [address]       Ph: [phone]       Fax: [phone]   RxID:   [id number]

## 2010-03-19 NOTE — Medication Information (Signed)
Summary: Order for Diabetic Testing Supplies  Order for Diabetic Testing Supplies   Imported By: Maryln Gottron 03/13/2010 13:07:24  _____________________________________________________________________  External Attachment:    Type:   Image     Comment:   External Document

## 2010-04-28 LAB — COMPREHENSIVE METABOLIC PANEL
ALT: 15 U/L (ref 0–53)
AST: 24 U/L (ref 0–37)
Calcium: 8.7 mg/dL (ref 8.4–10.5)
GFR calc Af Amer: >60 mL/min (ref >60)
Sodium: 141 mEq/L (ref 135–145)
Total Protein: 6.8 g/dL (ref 6.0–8.3)

## 2010-04-28 LAB — PROTIME-INR: Prothrombin Time: 13.3 seconds (ref 11.6–15.2)

## 2010-06-04 NOTE — Assessment & Plan Note (Signed)
[facility]                         GASTROENTEROLOGY OFFICE NOTE   CHA, GOMILLION                     MRN:          [id number]  DATE:05/28/2007                            DOB:          1925-07-19    Mr. Gerald [name] came to the office today for evaluation of four days of  rectal bleeding.   Patient has had two episodes of bleeding a day, small amounts of blood,  for the last several days, without rectal or abdominal pain.  He has  diabetes and hypertension and is followed by Dr. Alwyn Ren with a complete  physical exam recently completed on May 18, 2007.  Additional problems  have included chronic GERD, arthritis, and a nonspecific anemia.  Last  colonoscopy exam was in October of 2004 and was normal, except for  internal hemorrhoids.  His last endoscopy exam was in July of 2008, at  which time he had a duodenal stricture that was dilated and some NSAID-  induced gastritis.  He does take aspirin 325 mg a day and an antacid at  night time.  He denies reflux symptoms or dysphagia at this time.   Patient has a history of rectal stenosis, but surprisingly denies  constipation.  He does have a family history of colon carcinoma in a  sibling.   He is a healthy-appearing white male, in no acute distress.  He weighs 164 pounds and blood pressure is 120/60 and pulse was 56 and  regular.  His abdominal exam was entirely benign.  Inspection of rectum showed no fissures or fistulae.  He had rather  marked rectal stenosis and it was very difficult to digitalize his  rectum with extreme discomfort.  There was soft, normal-colored stool in  the rectal vault that was +1 guaiac-positive.   ASSESSMENT:  I do not think Mr. Skaggs is having a diverticular or  significant lower GI bleed at this time.  He has rather marked rectal  stenosis and I suspect internal hemorrhoids or associated proctitis.   RECOMMENDATIONS:  1. Stat CBC ordered.  2. Colonoscopy  Monday afternoon with MOVIE prep.  3. Continue other multiple medications, listed above, with adjustment      of his diabetic medications for his procedures.     Vania Rea. Jarold Motto, MD, Caleen Essex, FAGA  Electronically Signed    DRP/MedQ  DD: 05/28/2007  DT: 05/28/2007  Job #: [id number]   cc:   Titus Dubin. Alwyn Ren, MD,FACP,FCCP

## 2010-06-04 NOTE — Assessment & Plan Note (Signed)
[facility]                         GASTROENTEROLOGY OFFICE NOTE   Gerald [name], Gerald [name]                     MRN:          [id number]  DATE:07/07/2006                            DOB:          April 20, 1925    Gerald [name] is a 77 year old white male referred by Dr. Alwyn Ren for  evaluation of reflux symptoms and dysphagia.   Gerald [name] is complaining of dysphagia with pills and a globus  sensation in his throat with chronic coughing and some hoarseness.  He  is diabetic and says he has lost 20 pounds of weight voluntarily, and  that he does not have anorexia or early satiety.  Gerald [name] has  previously seen me for screening colonoscopy because of a family history  of colon cancer in his brother, and this was negative in October of  2004.  I have not ever really done formal gastroenterology consultation.   Gerald [name] certainly denies true regurgitation, burning, or typical reflux  symptoms.  He denies any hepatobiliary complaints or lower GI problems  at this time.  Says his bowels are moving well and he is not having  melena or hematochezia.  He denies any specific food intolerances.  He  does not smoke, use ethanol, or NSAID.  Recent lab data on May 8 by Dr.  Alwyn Ren showed normal electrolytes panel, a hemoglobin A1c of 6.1.   PAST MEDICAL HISTORY:  Remarkable for adult-onset diabetes, degenerative  arthritis, and coronary artery disease with previous bypass surgery.  He  is followed by Dr. Charlton Haws in cardiology.  He also has had stent  placement in his coronary arteries.  His previous surgery was done by  Dr. Ofilia Neas in 2001.   MEDICATIONS:  1. Aspirin 325 mg a day.  2. Toprol XL 50 mg a day.  3. Metformin 500 mg twice a day.  4. Antacid pills 2 twice a day.  5. Fish oil daily.  6. Benazepril 10 mg a day.  7. Advicor 50 mg a day.  8. Actos 30 mg a day.  9. P.r.n. Viagra use.   He denies drug allergies.   FAMILY HISTORY:  Remarkable  for colon cancer in an older brother.   SOCIAL HISTORY:  Gerald [name] is married and lives with his wife.  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  He does not  smoke, or use ethanol.   REVIEW OF SYSTEMS:  Noncontributory without current cardiovascular,  pulmonary, genitourinary, neurological, orthopedic, or endocrine, or  neuropsychiatric problems.  He does have diabetes, but no peripheral  neuropathy, renal or ophthalmologic problems apparently.   EXAM:  He is an elderly, but healthy-appearing white male who is younger  than his stated age.  He is 5 feet 6 inches and weighs 153 pounds.  Blood pressure 102/60 and  pulse was 64 and regular.  I could not appreciate stigmata of chronic  liver disease.  CHEST:  Generally clear without wheezes or rhonchi.  Examination of oropharynx was unremarkable.  Appeared to be a regular  rhythm without significant murmurs, gallops, or rubs.  ABDOMEN:  No organomegaly,  masses, or tenderness.  Bowel sounds were  normal.  Peripheral extremities were unremarkable.  Mental status was clear.   ASSESSMENT:  1. Probable extraesophageal manifestations of gastroesophageal reflux      disease.  2. Vague weight loss, which mostly sounds voluntary in nature.  3. Family history of colon carcinoma with recent negative colonoscopy.  4. Adult onset diabetes mellitus.  5. Atherosclerosis of coronary artery disease and previous bypass      surgery.   RECOMMENDATIONS:  1. Outpatient endoscopy and ultrasonography.  2. Reflux regimen and movie, and have started AcipHex 20 mg twice a      day 30 minutes before meals.  3. Continue other medications as per Dr. Alwyn Ren.     Vania Rea. Jarold Motto, MD, Caleen [place], FAGA  Electronically Signed    DRP/MedQ  DD: 07/07/2006  DT: 07/07/2006  Job #: [id number]   cc:   Titus Dubin. Alwyn Ren, MD,FACP,FCCP

## 2010-06-04 NOTE — Letter (Signed)
September 17, 2006    Titus Dubin. Alwyn Ren, MD,FACP,FCCP  [phone] [place], [place] [address]   RE:  NESHAWN, AIRD  MRN:  [id number]  /  DOB:  Jan 22, 1925   Dear Annette Stable:   I hope you received my endoscopy report dated July 23 on Gerald [name].  He had a moderate sized hiatal hernia and peptic stricture of his  esophagus that I dilated and his dysphagia is pretty much gone.   He also had a very scarred pyloric channel requiring balloon dilation of  his pyloric channel.  Biopsies did show the presence of Helicobacter  pylori and he was treated with a Prevpac for 10 days.  He currently is  asymptomatic.  He is starting to gain his weight back and denies  dysphagia or early satiety.   I sat down and talked with Dorene Sorrow about the need for longterm PPI therapy  because of his age, his peptic stricture of his esophagus and also his  chronic salicylate use.  I will be glad to see him in the future should  he have recurrent problems.  Thank you for allowing me to see this most  interesting, pleasant patient.    Sincerely,      Vania Rea. Jarold Motto, MD, Caleen [place], FAGA  Electronically Signed    DRP/MedQ  DD: 09/17/2006  DT: 09/17/2006  Job #: [id number]

## 2010-06-07 NOTE — Op Note (Signed)
[facility]  Patient:    Gerald [name], [place]                     MRN: [id number] Proc. Date: 04/16/99 Adm. Date:  [id number] Attending:  Waldo Laine CC:         Arturo Morton. Riley Kill, M.D. [facility]                           Operative Report  PREOPERATIVE DIAGNOSIS:  Coronary occlusive disease with end stent stenosis.  POSTOPERATIVE DIAGNOSIS:  Coronary occlusive disease with end stent stenosis.  SURGICAL PROCEDURE:  Off pump coronary artery bypass grafting x two with left internal mammary to the left anterior descending coronary artery and reverse saphenous vein graft to the distal right coronary artery.  SURGEON: Gwenith Daily. Tyrone Sage, M.D.  FIRST ASSISTANT:  Salvatore Decent. Cornelius Moras, M.D.  SECOND ASSISTANT:  Lissa Merlin, P.A.  BRIEF HISTORY:  The patient is a 77 year old male who in January of this year had undergone extensive angioplasty of a complex proximal left anterior descending lesion with placement of three stents.  The patient returned for follow-up catheterization at this time which demonstrated severe end stent stenosis along all the entire proximal half of the left anterior descending.  In addition, he has approximately 60% proximal right coronary artery lesion.  The circumflex is a large vessel without significant stenosis.  Overall ventricular function is preserved.  The patient had a Cardiolite stress test that showed anterior ischemia with apical infarction.  Because of the extensive nature of the previous stenting procedure, it was felt it was in the patients best interest to proceed with bypass surgery rather than further angioplasty.  The patient agreed and signed informed consent.  PROCEDURE:  With Swan-Ganz and arterial line monitors in place, the patient underwent general endotracheal anesthesia without incident.  The skin of the chest and legs was prepped with Betadine and draped in the usual sterile manner.  A segment of  vein was harvested from the right lower extremity and a median sternotomy was performed.  The left internal mammary artery was taken down and ts pedicle grafted.  The distal artery was divided.  The pericardium was opened. he left anterior descending was easily visible as was the distal right coronary artery.  It was decided to perform the bypass off pump as the left anterior descending was not intramyocardial and adequate visualization with the heart beating could be obtained.  The patient was heparinized with 9000 units of heparin.  Deep pericardial sutures were placed to help elevate the heart.  A small lap pad was placed behind the heart and gave good exposure of the left anterior descending.  Using a ______ stabilizing foot, the site of the anastomosis was selected which was distal to the easily palpable third stent.  Retractor tapes were placed around the left anterior descending.  The stabilizing foot provided good exposure of the LAD with opening with some blood return.  The tapes were tightened with blower/mister.  A good visualization of the anastomosis could be obtained.  Using running 8-0 Prolene he left internal mammary artery was anastomosed to the left anterior descending coronary artery.  The vessel was deaired and anastomosis completed.  The vessel  loops were removed and the patient hemodynamically tolerated this period without difficulty. The fascia of the mammary artery was tacked to the epicardium.  Attention was then turned to the distal right coronary artery  which was a large  vessel.  Pacing wires had been applied to the epicardium of the heart.  The site of the anastomosis were selected and again using the ______ stabilizing foot, adequate exposure was obtained.  Retractor tapes were tightened and the vessel was opened. The vessel was at least 2 to 2.5 mm in size.  Using a running #7-0 Prolene, a segment of reverse saphenous vein graft was anastomosed  to the distal right coronary artery.  There was excellent back bleeding from the vein with release f the tapes.  A partial occlusion clamp was placed on the ascending aorta. The vein was trimmed to appropriate length and anastomosed to the ascending aorta. Air was evacuated from the graft and the partial occlusion clamp was removed.  The patient again remained hemodynamically stable during this procedure.  Protamine sulfate was administered with the operative field hemostatic.  Two right ventricular pacing  wires were applied.  Graft markers were applied.  Left pleural tube, two mediastinal tubes were left in place.  The pericardium was reapproximated.  The  sternum was closed with #6 stainless steel wire.  The fascia was closed with interrupted 0 Vicryl and running 3-0 Vicryl to the subcutaneous tissues, 4-0 silk subcuticular stitch and the skin edges.  Dry dressing.  Sponge and needle counts were reported as correct at the completion of the procedure.  The patient tolerated the procedure well without obvious complications and was transferred to the surgical intensive care unit for further postoperative care. DD:  04/16/99 TD:  04/17/99 Job: [id number]

## 2010-06-07 NOTE — Cardiovascular Report (Signed)
[place]  Patient:    Gerald [name], Gerald [name]                     MRN: [id number] Proc. Date: 04/12/99 Adm. Date:  [id number] Attending:  Ronaldo Miyamoto CC:         Titus Dubin. Alwyn Ren, M.D.             Noralyn Pick. Eden Emms, M.D.             CV Laboratory             Gwenith Daily. Tyrone Sage, M.D.                        Cardiac Catheterization  INDICATIONS:  The patient is a very pleasant 77 year old who has previously undergone percutaneous stenting of the LAD in three locations.  He had modest disease of the right coronary artery at that time.  He had follow up Cardiolite  done by Theron Arista C. Eden Emms, M.D. which demonstrated anterior ischemia, and as a result, he was referred for repeat catheterization.  Notably, the patient has been relatively asymptomatic.  The patient was seen in the office by Dr. Eden Emms and et up for cath.  PROCEDURE:  Left heart catheterization, selective coronary angiography, selective left ventriculography.  DESCRIPTION OF PROCEDURE:  The procedure was performed from the right femoral artery using 6-French catheters.  He tolerated the procedure well without complication.  Views of the subclavian artery were also performed.  HEMODYNAMIC DATA:  The central aortic pressure was 158/65, LV pressure 168/22. No gradient on pull back across the aortic valve.  ANGIOGRAPHIC DATA:  Ventriculography in the RAO projection reveals preserved global systolic function and ejection fraction of 76%.  No segmental abnormalities or contractions were identified.  The subclavian is widely patent.  The internal mammary also appeared to be patent.  The left main coronary artery is free of disease.  The proximal left anterior descending artery was previously stented.  This encompasses the origin of the first septal perforator.  There is about 70% narrowing throughout the stent with more severe disease in the proximal aspect f the stent, and there  is an 80% stenosis in the proximal septal perforator.  In he mid to distal vessel, there was a 90% stenosis at the mid stent followed by a second tandem 90% stenosis in the second stent.  The distal vessel is patent and is suitable for grafting.  The circumflex provided a proximal and small marginal branch, and then terminates as a large obtuse marginal branch that is free of critical disease.  The right coronary artery has about a 50-60% proximal to mid stenosis.  The distal vessel was suitable for grafting.  CONCLUSIONS: 1. Preserved overall left ventricular function. 2. High grade InStent restenosis with three separate left anterior descending    stents. 3. Moderate disease of the right coronary artery.  DISPOSITION:  I have called Dr. Eden Emms.  Based on the InStent restenosis at three separate sites, the risks of restenosis would be high even with radiation therapy. Subsequently, revascularization surgery will be recommended and a surgical consult will be obtained. DD:  04/12/99 TD:  04/15/99 Job: [id number]

## 2010-06-07 NOTE — Consult Note (Signed)
[place]. [facility]  Patient:    Gerald [name], Gerald [name]                     MRN: [id number] Proc. Date: 04/12/99 Adm. Date:  [id number] Attending:  Ronaldo Miyamoto CC:         Gwenith Daily Tyrone Sage, M.D.                          Consultation Report  REASON FOR CONSULTATION:  In-stent stenosis of previous angioplasty to LAD lesion and right coronary artery lesion.  HISTORY OF PRESENT ILLNESS:  Patient is a 77 year old male who has been in relatively good health his entire life, with the exception of mild diabetes, for which he is currently on Glucophage.  In January of this year, the patient had routine exam by his primary physician.  It was noted that the EKG at that time ad abnormal changes and was referred for a stress test.  This was performed at the  [facility], which demonstrated anterior ischemia.  A cardiac catheterization was arranged by Dr. Noralyn Pick. Nishan which showed high-grade LAD stenosis.  On January 2nd, patient underwent angioplasty and stenting of the three lesions of the proximal LAD with good angiographic result.  The patient has had no further chest pain since then but a followup stress test was done this week which demonstrated anterior ischemia with an area of apical infarction.  Because of these changes, a repeat cardiac catheterization was recommended and performed by Dr. Arturo Morton. Stuckey today.  This demonstrated significant in-stent stenosis within all three of the stents of at least 90%.  The circumflex was relatively ree of disease.  The proximal third of the right coronary artery had approximately 50-60% stenosis.  Overall ventricular function appeared preserved.  Because of he length of the area involved, it was felt that bypass surgery would be the preferred treatment.  Consideration of repeat angioplasty, with and without radiation, was not felt to have a great deal of success, because of the length and nature  of the lesion and coronary artery bypass grafting has been recommended.  ALLERGIES:  Patient denies any allergies.  CURRENT MEDICATIONS: 1. Glucophage 500 mg b.i.d., which the patient did not take this morning. 2. Vasotec 10 mg q.d. 3. Toprol 50 mg q.d. 4. Aspirin 325 mg one-half once a day. 5. In addition, the patient has been on Plavix for the past three days.  PREVIOUS SURGICAL HISTORY:  Bilateral inguinal hernia repairs and surgery for disc disease in 1996.  REVIEW OF SYSTEMS:  Patient denies any constitutional symptoms.  He has had no fever or chills, denies any visual changes, has had no previous history of stroke. He has noted some nose bleeds over the past several weeks and these were recently cauterized by Dr. Zola Button T. Wolicki.  He denies any shortness of breath.  He denies any chest pain, even during the stress test.  Patient denies any abdominal discomfort.  He has had no blood in his stool or urine.  Denies any claudication.  PHYSICAL EXAMINATION:  VITAL SIGNS:  Patients blood pressure is 140/80, pulse is 75 with occasional PACs, respiratory rate is 18.  GENERAL:  Patient is alert and oriented, able to relate his history without difficulty.  HEENT:  Pupils are equal, round and reactive to light.  NECK:  Without adenopathy or jugular venous distention.  He has no carotid bruits.  LUNGS:  Clear  bilaterally.  CARDIAC:  He has a normal S1 and S2.  No murmur is appreciated.  PMI is not displaced.  ABDOMEN:  Normal span of the liver and spleen is not palpable.  There is no other organomegaly.  The aortic caliber is not felt to be palpably enlarged.  EXTREMITIES:  Examination of the lower extremities reveals no obvious joint deformity.  Patient has PT and DP pulses palpable bilaterally.  ASSESSMENT:  Cardiac catheterization was performed by Dr. Riley Kill and was reviewed with him which demonstrates greater than 80% stenosis of the proximal  LAD involving the three stents.  A small diagonal is involved.  It is not clear if this is bypassable because of small size.  The right coronary artery has approximately 50-60% proximal third stenosis.  Overall ventricular function is preserved. Because of the patients positive stress test, diabetic state and now in-stent stenosis, coronary artery bypass grafting has been recommended.  The risks of death, infection, stroke, myocardial infarction, bleeding and blood transfusion  have all been discussed with the patient and his wife in detail.  He is willing to proceed with planned surgery.  We will tentatively plan this for Tuesday, March 27th.  Patient is agreeable with this plan and has had his questions answered. DD:  04/12/99 TD:  04/13/99 Job: [id number]

## 2010-06-07 NOTE — Procedures (Signed)
[place]. [facility]  Patient:    Gerald [name]                      MRN: [id number] Proc. Date: 01/22/99 Adm. Date:  [id number] Attending:  Colon Branch CC:         Titus Dubin. Alwyn Ren, M.D. [facility]             Peter C. Eden Emms, M.D. [facility]             CV Laboratory                           Procedure Report  PROCEDURE:  Percutaneous stenting procedure.  CARDIOLOGIST:  Arturo Morton. Riley Kill, M.D.  INDICATIONS:  Mr. Kann is a pleasant 77 year old who recently underwent an exercise Cardiolite study, demonstrating a large anteroapical defect.  He underwent a diagnostic catheterization by Dr. Theron Arista C. Nishan.  This revealed two very high-grade lesions in the left anterior descending artery.  A percutaneous intervention was recommended.  There was not critical disease involving the right coronary or the left circumflex artery.  The risks were discussed with the patient on the table, and then I went out and  spoke with his wife in detail as well.  HEMODYNAMIC DATA: Central aortic pressure:  163/71.  DESCRIPTION OF PROCEDURE:  The diagnostic procedure was performed by Dr. Eden Emms  from the right femoral artery.  The 6-French sheath was exchanged for a 7-French sheath.  Heparin and ReoPro were given according to protocol.  A JL3.5 Bright Tip Cordis guiding catheter was utilized to cannulate the left main artery.  The lesion was crossed with a 0.014 Hi-Torque floppy wire.  Predilatation was performed on the lesion in the midportion of the distal vessel, and as well the proximal lesion. We then placed a 2.5 mm BX velocity stent in the midportion of the distal vessel, ith excellent angiographic appearance.  We then placed a 3.0 mm BX velocity 18.0 mm  length stent in the proximal lesion.  This was an 18.0 mm length stent, and was  dilated up to about 14 atmospheres.  We then post-dilated using a 3.5 mm Rafter  balloon across the whole length of the  stent.  There was marked improvement in he appearance of the artery.  In the proximal portion of the distal vessel, just beyond the diagonal and beyond the calcified area, there was an area of narrowing of about 70%.  I then called Dr. Eden Emms to the laboratory, where we carefully reviewed all the images, to decide whether to stent this area.  I elected to primarily stent this area.  A 2.5 mm BX velocity 13.0 mm length stent was then placed across this area, and taken up to about 14-15 atmospheres, with an excellent angiographic appearance.  The procedure was then completed.  All catheters were  removed and the femoral sheath was sewn into place.  HEMODYNAMIC DATA:  The hemodynamic data is previously noted. Central aortic pressure:  At the beginning of the case was 163/71.  Just prior o intervention, the central aortic pressure was 151/68.  ANGIOGRAPHIC DATA: 1. Left main coronary artery:  There was mild tapered narrowing of the left main    coronary artery. 2. Left anterior descending coronary artery:  There was moderate calcification    of the left anterior descending coronary artery.  The distal vessel wrapped    the apex.  Prior to  the septal perforator, there was a 90%-95% area of    proximal segmental stenosis.  The vessel then opened up and was segmentally    plaqued up to approximately 10 mm beyond the septal perforator.  This was    about 50% in this area.  Following stenting, the proximal area was reduced    to 0% residual luminal narrowing.  There was perhaps mild ostial spasm    of the septal perforator, but no reduction in flow.  Beyond this area there    were two small diagonals, and then a third diagonal, which has about an 80%    proximal stenosis.  Just beyond this area, there was a 70% area of plaquing.    This was the last area stented, and was reduced to 0% residual luminal    narrowing with an excellent appearance, using the BX velocity stent.  The    third  stenosis was 95% prior to dilatation, and then was reduced to 0%.    The distal vessel wrapped the apex.  CONCLUSIONS: 1. Successful percutaneous stenting of the proximal left anterior descending    artery, as described above. 2. Successful stenting of the proximal portion of the distal left anterior    descending artery. 3. Successful stenting of the midportion of the distal left anterior descending    artery.  DISPOSITION:  The patient will be treated medically. He will be followed closely. We elected to use ReoPro and stenting, with the hopes of reducing target vessel  revascularization; however, there may be a mild increase in the risk of target vessel revascularization, and the patient may be a candidate for radiation therapy if this would become an issue down the road.  He does have mild disease in the right coronary artery proximally, but this does not appear to be flow-limiting t the present time, and there is no evidence of a perfusion defect in this territory.  FOLLOWUP:  Will be with Dr. Titus Dubin. Hopper and with Dr. Eden Emms. DD:  01/22/99 TD:  01/22/99 Job: [id number]

## 2010-06-07 NOTE — Discharge Summary (Signed)
[place]. [facility]  Patient:    Gerald [name], Gerald [name]                     MRN: [id number] Adm. Date:  [id number] Disc. Date: 04/20/99 Attending:  Waldo [name] Dictator:   Gerald [name], P.A. CC:         Gerald [name], Gerald [name]. [facility]             Gerald [name], Gerald [name]. [facility]             Gerald [name], Gerald [name]. [facility]             Gerald [name], Gerald [name].                           Discharge Summary  DATE OF BIRTH:  05-06-1925  FINAL DIAGNOSES: 1. Coronary artery disease. 2. Diabetes mellitus type 2. 3. Hypertension.  PROCEDURES:  April 12, 1999:  Left heart catheterization per Gerald [name], Gerald [name]. April 16, 1999:  Coronary artery bypass x 2 with LIMA to LAD and saphenous vein graft to distal right coronary artery, off pump.  SURGEON:  Gerald [name]. Gerald [name], Gerald [name].  HISTORY OF PRESENT ILLNESS:  This is a 77 year old gentleman who is in relatively good health. He does have diabetes mellitus type 2 and currently on Glucophage for this. In January of this year, the patient had a routine exam per his primary physician; and it was noted that EKG at that time had abnormal changes; and he was referred for a stress test. That was performed at [facility] office which demonstrated anterior ischemia. Because of this, he was scheduled to undergo a cardiac catheterization.  HOSPITAL COURSE:  The patient was admitted to [facility] on April 12, 1999. At that time, he underwent a left heart catheterization performed by Gerald [name], Gerald [name]. This showed significant disease in his LAD and his right coronary artery. Because of this significant disease which was 90% at the proximal and 90% mid LAD lesion with 56% right coronary artery lesion, he was referred to [facility]. Gerald [name], Gerald [name]. for surgical revascularization. Gerald [name], Gerald [name]. spoke with the patient. He discussed the surgery. Risks and benefits were explained. The patient understood and agreed  to surgery. On April 16, 1999, the patient underwent a coronary artery bypass x 2 with the LIMA to LAD and a saphenous vein graft to the distal right coronary artery. This was done off pump. The patient tolerated the procedure well. No intraoperative complications occurred. Postoperatively, the patient did satisfactory. No untoward events occurred during his stay. He was extubated on operative day. On the second postoperative day, he was transferred to the step-down unit on April 18, 1999. He continued to progress in a satisfactory manner. His incision was healing satisfactorily. No sign of infection noted. He was doing quite well.  LABORATORY DATA:  His white cell count was 8.9, hemoglobin 9.5, hematocrit 28.3, platelets were 193,000. His sodium was 136, potassium 4.3, chloride 101, CO2 29, BUN 16, creatinine 1.1, glucose 141, and calcium of 8.  He was doing well. He was ambulating without difficulty. Tolerating diet satisfactorily and subsequently prepared for discharge.  DISCHARGE MEDICATIONS: 1. Glucophage 500 mg b.i.d. 2. Toprol 50 mg q.d. 3. Enteric-coated aspirin one q.d. 4. Tylox one to two p.o. q.4-6h. p.r.n. pain. 5. Folic acid 1 mg q.d.  FOLLOW-UP:  The patient will follow with Theron Arista C.  Eden [name], Gerald [name]. in two weeks. He will see Ramon Dredge B. Gerald [name], Gerald [name]. in three weeks.  DISPOSITION:  The patient subsequently discharged home in satisfactory and stable condition on April 20, 1999. DD:  04/19/99 TD:  04/20/99 Job: [id number]

## 2010-06-07 NOTE — Cardiovascular Report (Signed)
[place]. [facility]  Patient:    Gerald [name]                      MRN: [id number] Proc. Date: 01/21/98 Adm. Date:  [id number] Attending:  Colon Branch CC:         Titus Dubin. Alwyn Ren, M.D. [facility]             Cardiac Catheterization Laboratory             Noralyn Pick. Eden Emms, M.D. [facility]                        Cardiac Catheterization  PROCEDURE PERFORMED:  Coronary arteriography.  INDICATIONS:  The patient had PVCs on his electrocardiogram.  He subsequently had a markedly positive Cardiolite study with anterior apical wall ischemia.  CORONARY ARTERIOGRAPHY:  Left main coronary artery:  The left main coronary artery had a 20% discrete stenosis.  Left anterior descending artery:  The proximal LAD had a 95% discrete stenosis hat was a few centimeters away from the left main orifice.  The patient had three small diagonal branches.  The mid to distal LAD also had a 95% tubular type lesion.  The distal vessel was large and wrapped the apex.  Circumflex coronary artery:  The circumflex coronary artery was nondominant. There was 30% multiple discrete lesions in the midportion.  Right coronary artery:  The right coronary artery was dominant.  There was 30-40% tubular lesion in the proximal portion just after the bend.  The distal vessel as normal sized diseased.  RIGHT ANTERIOR OBLIQUE VENTRICULOGRAPHY:  The RAO ventriculography revealed minimal anterior apical wall hypokinesis.  There was no MR.  There is no gradient across the aortic valve.  Aortic pressure was in the 160/80 range and there was no gradient across the aortic valve.  IMPRESSION:  The films will be reviewed with Dr. Riley Kill and we will plan angioplasty and stenting of the left anterior descending. DD:  01/21/98 TD:  01/22/99 Job: [id number]

## 2010-06-07 NOTE — Op Note (Signed)
Gerald [name], Gerald [name]              ACCOUNT NO.:  [id number]   MEDICAL RECORD NO.:  [id number]          PATIENT TYPE:  AMB   LOCATION:  SDS                          FACILITY:  [facility]   PHYSICIAN:  John D. Ashley Royalty, M.D. DATE OF BIRTH:  29-Mar-1925   DATE OF PROCEDURE:  11/07/2004  DATE OF DISCHARGE:                                 OPERATIVE REPORT   ADMISSION DIAGNOSIS:  Macular hole, right eye, and diabetic retinopathy,  right eye.   PROCEDURES:  Pars plana vitrectomy, right eye; retinal photocoagulation,  right eye; gas fluid exchange, right eye; and the membrane peel, right eye;  serum patch, right eye; Perfluoropropane injection, right eye.   SURGEON:  Alan Mulder.   ASSISTANT:  Rosalie Doctor, MA   ANESTHESIA:  General.   DETAILS:  Usual prep and drape.  The indirect ophthalmoscope laser was moved  into place, 968 burns placed around the retinal periphery with a power  between 400 and 800 milliwatts, 1000 microns each, 0.05 seconds each.  Attention was carried to the pars plana area where sclerotomies were made at  8, 10 and 2 o'clock,  5 mm infusion at 8 o'clock, the lighted pick and the  cutter were placed at 10 and 2 o'clock respectively. Contact lens ring  anchored into place at 6 and 12 o'clock. Provisc placed on the corneal  surface. The flat contact lens was placed for viewing. Pars plana vitrectomy  was begun just behind the cataractous lens. Some blood and vitreous was  encountered. This was carefully removed under low suction and rapid cutting,  drawn down to the macular surface where additional membranes were seen. The  silicone tip suction line was inserted into the eye and down to the macular  surface where the fish strike sign occurred in several places. The membranes  were peeled with the lighted pick and the extrusion line. Additional  vitrectomy was used to remove posterior vitreous and peripheral vitreous.  The Biom viewing system was moved into place. After  all peripheral vitreous  was removed, the magnifying contact lens was placed and diamond dusted  membrane scraper was used to remove thin, diaphanous membranes from their  attachments to the edge of the macular hole. These membranes were released  and pushed forward  into the macular hole. Gas fluid exchange was carried  out. Sufficient time was allowed for additional fluid to track down the  walls of the eye and collect in the posterior segment. The serum patch was  prepared during this time and the Perfluoropropane mixture of 17% was  prepared during this time.  Additional fluid was removed [place] ophthalmics  brush.  Serum patch was delivered.  Perfluoropropane was exchanged for  intravitreal gas. The instruments were removed from the eye and 9-0 nylon  was used to close the sclerotomy sites.  They were tested and found to be  tight. The conjunctiva was closed with wet-field cautery. Polymyxin and  gentamicin were irrigated into Tenon's space. Atropine solution was applied.  Marcaine was injected around the globe for postop pain. Decadron 10  milligrams was injected to the lower subconjunctival  space. Tobradex, patch  and shield were placed. Closing tension was 10 with a Bar[place]quer tonometer.  Complications none. Duration 1 hour. The patient awakened, taken to recovery  in satisfactory condition.      Beulah Gandy. Ashley Royalty, M.D.  Electronically Signed     JDM/MEDQ  D:  11/07/2004  T:  11/07/2004  Job:  [id number]

## 2010-06-14 ENCOUNTER — Other Ambulatory Visit: Payer: Self-pay | Admitting: *Deleted

## 2010-06-14 DIAGNOSIS — I499 Cardiac arrhythmia, unspecified: Secondary | ICD-10-CM

## 2010-06-14 DIAGNOSIS — E119 Type 2 diabetes mellitus without complications: Secondary | ICD-10-CM

## 2010-06-18 ENCOUNTER — Other Ambulatory Visit (INDEPENDENT_AMBULATORY_CARE_PROVIDER_SITE_OTHER): Payer: Medicare Other

## 2010-06-18 DIAGNOSIS — I499 Cardiac arrhythmia, unspecified: Secondary | ICD-10-CM

## 2010-06-18 DIAGNOSIS — E119 Type 2 diabetes mellitus without complications: Secondary | ICD-10-CM

## 2010-06-18 LAB — CBC WITH DIFFERENTIAL/PLATELET
Basophils Absolute: 0 10*3/uL (ref 0.0–0.1)
Eosinophils Absolute: 0.1 10*3/uL (ref 0.0–0.7)
Hemoglobin: 13.1 g/dL (ref 13.0–17.0)
Lymphocytes Relative: 25 % (ref 12.0–46.0)
MCHC: 33.6 g/dL (ref 30.0–36.0)
MCV: 89.8 fl (ref 78.0–100.0)
Monocytes Absolute: 0.5 10*3/uL (ref 0.1–1.0)
Neutro Abs: 3.3 10*3/uL (ref 1.4–7.7)
Neutrophils Relative %: 64 % (ref 43.0–77.0)
RDW: 14.6 % (ref 11.5–14.6)

## 2010-06-18 LAB — LIPID PANEL
Cholesterol: 147 mg/dL (ref 0–200)
HDL: 42 mg/dL (ref >39.00)
LDL Cholesterol: 89 mg/dL (ref 0–99)
Total CHOL/HDL Ratio: 4
Triglycerides: 79 mg/dL (ref 0.0–149.0)

## 2010-06-18 LAB — HEPATIC FUNCTION PANEL
Albumin: 3.9 g/dL (ref 3.5–5.2)
Alkaline Phosphatase: 46 U/L (ref 39–117)
Total Protein: 6.7 g/dL (ref 6.0–8.3)

## 2010-06-18 LAB — CREATININE, SERUM: Creatinine, Ser: 1.1 mg/dL (ref 0.4–1.5)

## 2010-06-18 LAB — POTASSIUM: Potassium: 5.4 mEq/L — ABNORMAL HIGH (ref 3.5–5.1)

## 2010-06-18 LAB — BUN: BUN: 11 mg/dL (ref 6–23)

## 2010-06-24 ENCOUNTER — Ambulatory Visit (INDEPENDENT_AMBULATORY_CARE_PROVIDER_SITE_OTHER): Payer: Medicare Other | Admitting: Internal Medicine

## 2010-06-24 ENCOUNTER — Encounter: Payer: Self-pay | Admitting: Internal Medicine

## 2010-06-24 DIAGNOSIS — I10 Essential (primary) hypertension: Secondary | ICD-10-CM

## 2010-06-24 DIAGNOSIS — E785 Hyperlipidemia, unspecified: Secondary | ICD-10-CM

## 2010-06-24 DIAGNOSIS — E119 Type 2 diabetes mellitus without complications: Secondary | ICD-10-CM

## 2010-06-24 MED ORDER — METOPROLOL SUCCINATE ER 50 MG PO TB24: 50.0000 mg | ORAL_TABLET | Freq: Every day | ORAL | Status: DC

## 2010-06-24 MED ORDER — PRAVASTATIN SODIUM 20 MG PO TABS: 20.0000 mg | ORAL_TABLET | Freq: Every day | ORAL | Status: DC

## 2010-06-24 NOTE — Assessment & Plan Note (Addendum)
BP controlled on Beta Blocker; renal function normal but K+ 5.4 w/o ACE-I Plan : monitor BUN , creat, K+ 4 months. Avoid citrus & bananas

## 2010-06-24 NOTE — Assessment & Plan Note (Signed)
Good control w/o complication Plan: monitor A1c, microalbumin every 4-6 months

## 2010-06-24 NOTE — Progress Notes (Signed)
  Subjective:    Patient ID: Gerald [name], male    DOB: 19-Oct-1925, 77 y.o.   MRN: [id number]  HPI # 1 HYPERTENSION Disease Monitoring: Blood pressure range-not checking BP @ home  Chest pain, palpitations- no       Dyspnea- no Medications:Beta blocker Compliance- yes  Lightheadedness,Syncope- no    Edema- no Labs  reviewed ; all @goal    #2 DIABETES Disease Monitoring:see A1c below Blood Sugar ranges-125-135 Polyuria/phagia/dipsia- no       Visual problems- Macular Degeneration followed by Dr Harriette Bouillon Medications:off all Diabetes meds now Hypoglycemic symptoms- no A1c 6.6% ( average sugar 143, risk 32 %)   #3 HYPERLIPIDEMIADisease Monitoring: LDL 89,HDL 42 See symptoms for Hypertension Medications: Compliance- yes  Abd pain, bowel changes- no   Muscle aches- no   ROS See HPI above   PMH Smoking Status noted : quit 15 years ago       Review of Systems     Objective:   Physical Exam Gen.: Healthy and well-nourished in appearance. Alert, appropriate and cooperative throughout exam. Appears younger than age Eyes: No corneal or conjunctival inflammation noted. Pupils equal round reactive to light and accommodation. Ptosis bilaterallyEars: Neck: No deformities, masses, or tenderness noted. Thyroid normal. Lungs: Normal respiratory effort; chest expands symmetrically. Lungs are clear to auscultation without rales, wheezes, or increased work of breathing.Barrel chest Heart: Slow  rate and minimally irregular  rhythm. No gallop, click, rub or  murmur. Abdomen: Bowel sounds normal; abdomen soft and nontender. No masses, organomegaly or hernias noted. No clubbing, cyanosis, edema, or deformity noted. Joints normal. Mild R great toenail deformity. Vascular: Carotid, radial artery, dorsalis pedis and dorsalis posterior tibial pulses are full and equal. No bruits present. Neurologic: Alert and oriented x3.  Skin: Intact without suspicious lesions or rashes.Keloid op  scars Lymph: No cervical, axillary, or inguinal lymphadenopathy present. Psych: Mood and affect are normal. Normally interactive                                                                                        Assessment & Plan:  #1 DM , well controlled #2 HTN controlled #3 dyslipidemia @ goal #4 mild hypekalemia Plan: decrease citrus  & bananas ; monitor K+ in 4-6 mos

## 2010-06-24 NOTE — Patient Instructions (Signed)
Diabetes Monitor  NORMAL VALUES  Non diabetic adults: 5 %-6.1%  Good diabetic control: 6.2-6.4 %  Fair diabetic control: 6.5-7%  Poor diabetic control: greater than 7 % ( except with additional factors such as  advanced age; significant coronary or neurologic disease,etc). Check the A1c every 6 months if it is < 6.5%; every 4 months if  6.5% or higher. Goals for home glucose monitoring are : fasting  or morning glucose goal of  90-150. Two hours after any meal , goal = < 180, preferably < 150.Check A1c, microalbumin, BUN, creat,K+ in  4-6 months

## 2010-06-25 NOTE — Progress Notes (Signed)
Labs only

## 2010-08-13 ENCOUNTER — Other Ambulatory Visit: Payer: Self-pay

## 2010-08-13 MED ORDER — MONTELUKAST SODIUM 10 MG PO TABS: 10.0000 mg | ORAL_TABLET | Freq: Every day | ORAL | Status: DC

## 2010-08-13 NOTE — Telephone Encounter (Signed)
Patient was here in the office with his wife and requested refill request

## 2010-11-01 ENCOUNTER — Encounter: Payer: Self-pay | Admitting: Internal Medicine

## 2010-11-04 ENCOUNTER — Encounter: Payer: Self-pay | Admitting: Internal Medicine

## 2010-11-04 ENCOUNTER — Ambulatory Visit (INDEPENDENT_AMBULATORY_CARE_PROVIDER_SITE_OTHER): Payer: Medicare Other | Admitting: Internal Medicine

## 2010-11-04 DIAGNOSIS — E785 Hyperlipidemia, unspecified: Secondary | ICD-10-CM

## 2010-11-04 DIAGNOSIS — I10 Essential (primary) hypertension: Secondary | ICD-10-CM

## 2010-11-04 DIAGNOSIS — E119 Type 2 diabetes mellitus without complications: Secondary | ICD-10-CM

## 2010-11-04 LAB — HEPATIC FUNCTION PANEL
AST: 20 U/L (ref 0–37)
Albumin: 4.3 g/dL (ref 3.5–5.2)
Alkaline Phosphatase: 52 U/L (ref 39–117)
Total Protein: 7.4 g/dL (ref 6.0–8.3)

## 2010-11-04 LAB — LIPID PANEL
Cholesterol: 167 mg/dL (ref 0–200)
HDL: 46.6 mg/dL (ref >39.00)
VLDL: 14.6 mg/dL (ref 0.0–40.0)

## 2010-11-04 LAB — CREATININE, SERUM: Creatinine, Ser: 1 mg/dL (ref 0.4–1.5)

## 2010-11-04 LAB — MICROALBUMIN / CREATININE URINE RATIO: Microalb Creat Ratio: 0.3 mg/g (ref 0.0–30.0)

## 2010-11-04 MED ORDER — FLUTICASONE PROPIONATE 50 MCG/ACT NA SUSP: 2.0000 | Freq: Two times a day (BID) | NASAL | Status: DC

## 2010-11-04 NOTE — Progress Notes (Signed)
  Subjective:    Patient ID: Gerald [name], male    DOB: 09/08/1925, 77 y.o.   MRN: [id number]  HPI    Review of Systems     Objective:   Physical Exam light touch normal over feet; minor toenail changes        Assessment & Plan:

## 2010-11-04 NOTE — Patient Instructions (Signed)
Eat a low-fat diet with lots of fruits and vegetables, up to 7-9 servings per day. Avoid obesity; your goal is waist measurement < 40 inches.Consume less than 40 grams of sugar per day from foods & drinks with High Fructose Corn Sugar as #1,2,3 or # 4 on label. Follow the low carb nutrition program in [employer] as closely as possible to prevent Diabetes progression & complications. White carbohydrates (potatoes, rice, bread, and pasta) have a high spike of sugar and a high load of sugar. For example a  baked potato has a cup of sugar and a  french fry  2 teaspoons of sugar. Yams, wild  rice, whole grained bread &  wheat pasta have been much lower spike and load of  sugar. Portions should be the size of a deck of cards or your palm. Blood Pressure Goal  Ideally is an AVERAGE < 135/85. This AVERAGE should be calculated from @ least 5-7 BP readings taken @ different times of day on different days of week. You should not respond to isolated BP readings , but rather the AVERAGE for that week  

## 2010-11-04 NOTE — Progress Notes (Signed)
  Subjective:    Patient ID: Gerald [name], male    DOB: 1926/01/11, 77 y.o.   MRN: [id number]  HPI  HYPERTENSION: Disease Monitoring:125/68-70 @ drug store Blood pressure range-120  Chest pain, palpitations- no       Dyspnea- no Medications: Compliance- yes  Lightheadedness,Syncope- no    Edema- no  DIABETES: Disease Monitoring: Blood Sugar ranges-113- 156  Polyuria/phagia/dipsia- no      Visual problems- yes from Macular degeneration Medications: Compliance- off Metformin  Hypoglycemic symptoms- no  HYPERLIPIDEMIA: Medications: Compliance- yes  Abd pain, bowel changes- no   Muscle aches- no    PMH Smoking Status ;quit 1982         Review of Systems     Objective:   Physical Exam Gen.: Healthy  & well-nourished, appropriate and alert, weight appropriate. Appears younger than age Eyes: No lid/conjunctival changes, extraocular motion intact, fundi: arteriolar narrowing Neck: No  masses , thyroid normal but susbsternal Respiratory: No increased work of breathing or abnormal breath sounds Cardiac : regular rhythm, no extra heart sounds, gallop, murmur. S 4 with slight slurring  Lymph: No lymphadenopathy of the neck or axilla Skin: No rashes, lesions, ulcers or ischemic changes Muscle skeletal: no nail changes; joints essentially normal Vasc:pedal pulses intact but decreased, especially PTP, no bruits present Neuro: Normal deep tendon reflexes, alert & oriented, sensation over feet normal Psych: judgment and insight, mood and affect normal        Assessment & Plan:  #1 hypertension, controlled  #2 dyslipidemia questionable  status  #3 diabetes; fasting blood sugars suggests excellent control. This is on diet and exercise alone. Plan: See orders and recommendations

## 2010-12-30 ENCOUNTER — Other Ambulatory Visit: Payer: Self-pay | Admitting: Internal Medicine

## 2011-04-16 ENCOUNTER — Encounter (INDEPENDENT_AMBULATORY_CARE_PROVIDER_SITE_OTHER): Payer: Medicare Other | Admitting: Ophthalmology

## 2011-04-16 DIAGNOSIS — H35349 Macular cyst, hole, or pseudohole, unspecified eye: Secondary | ICD-10-CM

## 2011-04-16 DIAGNOSIS — H43819 Vitreous degeneration, unspecified eye: Secondary | ICD-10-CM

## 2011-04-16 DIAGNOSIS — H3530 Unspecified macular degeneration: Secondary | ICD-10-CM

## 2011-05-13 ENCOUNTER — Encounter: Payer: Self-pay | Admitting: Internal Medicine

## 2011-05-13 ENCOUNTER — Ambulatory Visit (INDEPENDENT_AMBULATORY_CARE_PROVIDER_SITE_OTHER): Payer: Medicare Other | Admitting: Internal Medicine

## 2011-05-13 VITALS — BP 138/80 | HR 68 | Wt 146.0 lb

## 2011-05-13 DIAGNOSIS — R634 Abnormal weight loss: Secondary | ICD-10-CM

## 2011-05-13 DIAGNOSIS — G479 Sleep disorder, unspecified: Secondary | ICD-10-CM

## 2011-05-13 DIAGNOSIS — G478 Other sleep disorders: Secondary | ICD-10-CM

## 2011-05-13 LAB — CBC WITH DIFFERENTIAL/PLATELET
Basophils Absolute: 0 10*3/uL (ref 0.0–0.1)
Eosinophils Relative: 0.2 % (ref 0.0–5.0)
Lymphocytes Relative: 16 % (ref 12.0–46.0)
Lymphs Abs: 1.3 10*3/uL (ref 0.7–4.0)
Monocytes Relative: 9.2 % (ref 3.0–12.0)
Neutrophils Relative %: 74.4 % (ref 43.0–77.0)
Platelets: 235 10*3/uL (ref 150.0–400.0)
RDW: 14.7 % — ABNORMAL HIGH (ref 11.5–14.6)
WBC: 8.2 10*3/uL (ref 4.5–10.5)

## 2011-05-13 LAB — TSH: TSH: 1.43 u[IU]/mL (ref 0.35–5.50)

## 2011-05-13 MED ORDER — CLONAZEPAM 0.5 MG PO TABS: ORAL_TABLET | ORAL | Status: DC

## 2011-05-13 NOTE — Patient Instructions (Signed)
To prevent sleep dysfunction follow these instructions for sleep hygiene. Do not read, watch TV, or eat in bed. Do not get into bed until you are ready to turn off the light &  to go to sleep. Do not ingest stimulants ( decongestants, diet pills, nicotine, caffeine) after the evening meal.  

## 2011-05-13 NOTE — Progress Notes (Signed)
  Subjective:    Patient ID: Gerald [name], male    DOB: [birth year], 78 y.o.   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 Home health has been called in to help with caregiver needs.  He has lost 10-15 pounds over the last several months. Last TSH was therapeutic in October 2012. He denies abdominal pain, rectal bleeding, or melena    Review of Systems  Review of sleep pattern  Difficulty going to sleep:yes Frequent awakening:yes Early awakening:yes, 3-4 am Nightmares:sometimes Abnormal leg movement:no Snoring:no Apnea:no Stimulants:no Alcohol intake:no Reading, watching TV, eating in bed: no: Daytime naps:rarely Work/travel factors:no  Daytime hypersomnolence: no Motor vehicle accident/motor dysfunction:no Treatment to date/efficacy: none       Objective:   Physical Exam Gen.:  well-nourished; in no acute distress Neck: full ROM; thyroid normal Eyes: Extraocular motion intact; no lid lag. Bilateral proptosis Heart: Normal rhythm and rate without significant murmur, gallop. Occasional premature  beat Lungs: Chest clear to auscultation without rales,rales, wheezes Abdomen: No organomegaly, masses, tenderness Neuro:Deep tendon reflexes are equal and within normal limits; fine RUE tremor  Skin: Warm and dry without significant lesions or rashes; no onycholysis Lymph: No cervical or axillary lymphadenopathy Psych: Normally communicative and interactive; no abnormal mood or affect clinically.         Assessment & Plan:  #1sleep disorder in the context of excess stress as caregiver   #2 weight loss  Plan: See orders and recommendations

## 2011-05-28 ENCOUNTER — Encounter (INDEPENDENT_AMBULATORY_CARE_PROVIDER_SITE_OTHER): Payer: Medicare Other | Admitting: Ophthalmology

## 2011-05-28 DIAGNOSIS — H35349 Macular cyst, hole, or pseudohole, unspecified eye: Secondary | ICD-10-CM

## 2011-05-28 DIAGNOSIS — H3530 Unspecified macular degeneration: Secondary | ICD-10-CM

## 2011-05-28 DIAGNOSIS — H35359 Cystoid macular degeneration, unspecified eye: Secondary | ICD-10-CM

## 2011-05-28 DIAGNOSIS — H43819 Vitreous degeneration, unspecified eye: Secondary | ICD-10-CM

## 2011-06-04 ENCOUNTER — Ambulatory Visit (INDEPENDENT_AMBULATORY_CARE_PROVIDER_SITE_OTHER): Payer: Medicare Other | Admitting: Ophthalmology

## 2011-06-04 DIAGNOSIS — H2700 Aphakia, unspecified eye: Secondary | ICD-10-CM

## 2011-06-04 DIAGNOSIS — H3530 Unspecified macular degeneration: Secondary | ICD-10-CM

## 2011-06-13 ENCOUNTER — Other Ambulatory Visit: Payer: Self-pay

## 2011-06-13 ENCOUNTER — Encounter (HOSPITAL_COMMUNITY): Payer: Self-pay | Admitting: *Deleted

## 2011-06-13 ENCOUNTER — Telehealth: Payer: Self-pay | Admitting: Internal Medicine

## 2011-06-13 ENCOUNTER — Emergency Department (HOSPITAL_COMMUNITY)
Admission: EM | Admit: 2011-06-13 | Discharge: 2011-06-13 | Disposition: A | Discharge status: Home / Self Care | Payer: Medicare Other | Attending: Emergency Medicine | Admitting: Emergency Medicine

## 2011-06-13 DIAGNOSIS — G478 Other sleep disorders: Secondary | ICD-10-CM

## 2011-06-13 DIAGNOSIS — Z79899 Other long term (current) drug therapy: Secondary | ICD-10-CM | POA: Insufficient documentation

## 2011-06-13 DIAGNOSIS — E119 Type 2 diabetes mellitus without complications: Secondary | ICD-10-CM | POA: Insufficient documentation

## 2011-06-13 DIAGNOSIS — Z7982 Long term (current) use of aspirin: Secondary | ICD-10-CM | POA: Insufficient documentation

## 2011-06-13 DIAGNOSIS — I10 Essential (primary) hypertension: Secondary | ICD-10-CM | POA: Insufficient documentation

## 2011-06-13 DIAGNOSIS — Z87891 Personal history of nicotine dependence: Secondary | ICD-10-CM | POA: Insufficient documentation

## 2011-06-13 DIAGNOSIS — D649 Anemia, unspecified: Secondary | ICD-10-CM | POA: Insufficient documentation

## 2011-06-13 MED ORDER — CLONAZEPAM 0.5 MG PO TABS: ORAL_TABLET | ORAL | Status: DC

## 2011-06-13 NOTE — Telephone Encounter (Signed)
Caller: Clover Mealy; PCP: Marga Melnick; CB#: [phone];  Call regarding new onset Irregular Heart Beat today 06/13/11 when checked by Home Health Nurse. Seen in office on 05/20/11 for weakness, weight loss and  trouble sleeping -started on sleeping med. Triage Per Irregular Heartrate Guideline and ED disposition for sudden irregular pulse rate and know coronary artery disease. Pt will go [facility].

## 2011-06-13 NOTE — ED Provider Notes (Signed)
Medical screening examination/treatment/procedure(s) were conducted as a shared visit with non-physician practitioner(s) and myself.  I personally evaluated the patient during the encounter Pleasant 78 year old man noted by visiting nurse to have irregular heartbeat. He denies chest pain.  Exam shows normal exam of heart and lungs.  EKG shows occasional PAC's, which were also present on his prior EKG.  Reassured and released.   Carleene Cooper III, MD 06/13/11 2051

## 2011-06-13 NOTE — ED Provider Notes (Signed)
2:05 PM  Date: 06/13/2011  Rate: 66  Rhythm: normal sinus rhythm and premature atrial contractions (PAC)  QRS Axis: normal  Intervals: normal  ST/T Wave abnormalities: normal  Conduction Disutrbances:none  Narrative Interpretation: Normal EKG  Old EKG Reviewed: unchanged    Carleene Cooper III, MD 06/13/11 1407

## 2011-06-13 NOTE — ED Notes (Signed)
Pt still denies pain. Pt denies having a fluttering in his chest. Pt denies having n/v, SOB. Pt denies dizziness. Pt ambulated to room from triage without difficulty. Pt alert and oriented, pt hard of hearing. Pt applied to monitor

## 2011-06-13 NOTE — Discharge Instructions (Signed)
You may increase your klonopin to two tablets by mouth at night to equal 1mg  to help with sleep but follow up closely with your primary care provider for further discuss your trouble with sleeping. Return to ER at any time for any emergent changing or worsening of symptoms.

## 2011-06-13 NOTE — ED Provider Notes (Signed)
History     CSN: [id number]  Arrival date & time 06/13/11  1230   First MD Initiated Contact with Patient 06/13/11 1326      Chief Complaint  Patient presents with  . Irregular Heart Beat    (Consider location/radiation/quality/duration/timing/severity/associated sxs/prior treatment) HPI  Patient who states he has not been sleeping well at night because "I a light sleeper" and states his wife had recent injury that she wakes a lot at night to get up and move around which wakes him often is complaining of poor night sleep as well as stating that his wife's home health nurse came to home today to check on wife and when he complained of not sleeping well she "check me out and told me my heart was irregular and that I needed to go to ER." Patient denies hx of known irregular heart beat. He has no other complaints. He denies fevers, chills, HA, dizziness, CP, SOB, weakness, or difficulty ambulating. Patient states he has seen his PCP about his poor sleep and was prescribed 0.5mg  of klonopin that he states he was been taking one pill a night but with ongoing night time awakenings and poor sleep.   Past Medical History  Diagnosis Date  . Hypertension   . Diabetes mellitus   . Anemia   . FH: colonic polyps   . Stricture     PMH OF    Past Surgical History  Procedure Date  . Lumbar laminectomy 1996  . Inguinal hernia repair 1998    Bilateral  . Esophageal dilation     x 1  . Macular degeneration surgery   . Cataract extraction     OD  . Colonoscopy w/ polypectomy 2009  . Coronary artery bypass graft     Family History  Problem Relation Age of Onset  . Colon cancer Brother   . Cancer Brother     COLON   . Arthritis Father   . Hypertension Mother   . Coronary artery disease Mother   . Stroke Mother   . Heart attack Mother 52  . Heart disease Mother 38    MI  . Diabetes Paternal Grandmother   . Diabetes Maternal Grandmother     History  Substance Use Topics  . Smoking  status: Former Smoker    Quit date: 01/21/1988  . Smokeless tobacco: Not on file   Comment: QUIT 1990'S  . Alcohol Use: No      Review of Systems  All other systems reviewed and are negative.    Allergies  Review of patient's allergies indicates no known allergies.  Home Medications   Current Outpatient Rx  Name Route Sig Dispense Refill  . ALUM & MAG HYDROXIDE-SIMETH 200-200-20 MG/5ML PO SUSP Oral Take 10 mLs by mouth at bedtime.    . ASPIRIN 325 MG PO TABS Oral Take 325 mg by mouth every evening.     Marland Kitchen VITAMIN D3 1000 UNITS PO CAPS Oral Take 1,000 Units by mouth daily.     Marland Kitchen ACID REDUCER PO Oral Take 1 tablet by mouth daily.     Marland Kitchen CLONAZEPAM 0.5 MG PO TABS Oral Take 0.25-0.5 mg by mouth at bedtime as needed. For anxiety/sleep.    . OMEGA-3 FATTY ACIDS 1000 MG PO CAPS Oral Take 1 g by mouth 2 (two) times daily.    Marland Kitchen FLUTICASONE PROPIONATE 50 MCG/ACT NA SUSP Nasal Place 2 sprays into the nose 2 (two) times daily as needed. For congestion.    Marland Kitchen METOPROLOL  SUCCINATE ER 50 MG PO TB24 Oral Take 50 mg by mouth daily.      Marland Kitchen ONE-DAILY MULTI VITAMINS PO TABS Oral Take 1 tablet by mouth daily.      Marland Kitchen PRAVASTATIN SODIUM 20 MG PO TABS Oral Take 20 mg by mouth every morning.       BP 145/48  Pulse 68  Temp(Src) 98.3 F (36.8 C) (Oral)  Resp 18  SpO2 100%  Physical Exam  Nursing note and vitals reviewed. Constitutional: He is oriented to person, place, and time. He appears well-developed and well-nourished. No distress.  HENT:  Head: Normocephalic and atraumatic.  Eyes: Conjunctivae are normal.  Neck: Normal range of motion. Neck supple.  Cardiovascular: Normal rate, regular rhythm, normal heart sounds and intact distal pulses.  Exam reveals no gallop and no friction rub.   No murmur heard. Pulmonary/Chest: Effort normal and breath sounds normal. No respiratory distress. He has no wheezes. He has no rales. He exhibits no tenderness.  Abdominal: Soft. Bowel sounds are normal. He  exhibits no distension and no mass. There is no tenderness. There is no rebound and no guarding.  Musculoskeletal: Normal range of motion. He exhibits no edema and no tenderness.  Neurological: He is alert and oriented to person, place, and time.  Skin: Skin is warm and dry. No rash noted. He is not diaphoretic. No erythema.  Psychiatric: He has a normal mood and affect. His behavior is normal. Judgment and thought content normal.    ED Course  Procedures (including critical care time)  Labs Reviewed - No data to display No results found.   1. Poor sleep pattern     Patient evaluated by Dr. Ignacia Palma at bedside  MDM  Patient has NSR with occasional PACs and question if this was the irregularity that nurse observed. Will rewrite some more klonopin 0.5 mg tablets so that he can try upping the dose to 1mg  at night and follow up with PCP for further management of insomnia and night time awakenings.   He has no other complaints denying CP, SOB, dizziness, weakness.         [place], [place] 06/13/11 1440

## 2011-06-13 NOTE — ED Notes (Signed)
Pt states home RN looks after his wife and today he told her he wasn't sleeping well so she checked him out. Home RN thought pt has irregular HR. Pt denies CP,N/V, SOB or dizzy. Pt has no complaints at this time, told to come by home RN.

## 2011-07-09 ENCOUNTER — Encounter: Payer: Self-pay | Admitting: Internal Medicine

## 2011-07-09 ENCOUNTER — Ambulatory Visit (INDEPENDENT_AMBULATORY_CARE_PROVIDER_SITE_OTHER): Payer: Medicare Other | Admitting: Internal Medicine

## 2011-07-09 VITALS — BP 154/78 | HR 58 | Temp 98.1°F | Wt 137.0 lb

## 2011-07-09 DIAGNOSIS — R5381 Other malaise: Secondary | ICD-10-CM

## 2011-07-09 DIAGNOSIS — I10 Essential (primary) hypertension: Secondary | ICD-10-CM

## 2011-07-09 DIAGNOSIS — F4329 Adjustment disorder with other symptoms: Secondary | ICD-10-CM

## 2011-07-09 DIAGNOSIS — F4320 Adjustment disorder, unspecified: Secondary | ICD-10-CM

## 2011-07-09 DIAGNOSIS — R634 Abnormal weight loss: Secondary | ICD-10-CM

## 2011-07-09 DIAGNOSIS — G479 Sleep disorder, unspecified: Secondary | ICD-10-CM

## 2011-07-09 DIAGNOSIS — R5383 Other fatigue: Secondary | ICD-10-CM

## 2011-07-09 MED ORDER — SERTRALINE HCL 50 MG PO TABS: ORAL_TABLET | ORAL | Status: DC

## 2011-07-09 NOTE — Patient Instructions (Addendum)
Blood Pressure Goal  Ideally is an AVERAGE < 135/85. This AVERAGE should be calculated from @ least 5-7 BP readings taken @ different times of day on different days of week. You should not respond to isolated BP readings , but rather the AVERAGE for that week  

## 2011-07-09 NOTE — Progress Notes (Signed)
Subjective:    Patient ID: Gerald [name], male    DOB: 1925/08/05, 78 y.o.   MRN: [id number]  HPI He c/o motivational fatigue for past 2-3 mo since wife started having increased health problems. States that he has been feeling depressed and anxious during this time, especially related to his (Macular degeneration) and his wife's health problems, and about the prospect of moving to a nursing home. Complains of anhedonia, but no anorexia. Has had a 23 lb weight loss, which he attributes to following the medically indicated low salt diet which his wife is following. Has chronic insomnia, which has increased in the past 2 months. Pt states that he has difficulty falling asleep, staying asleep, and wakes up early in the morning. Went to the ED on 06/13/11 (Note & EKG reviewed; no labs), due to concern about an irregular heart rhythm which a home health nurse for his wife auscultated, EKG at this time showed sinus bradycardia with PAC with aberrant conduction. Had been taking Clonazepam  for sleep, and was prescribed an increased dose to help with sleep by the ED.This did help. Pt concerned that his heart and/or BP may be causing his symptoms.  FATIGUE  Onset: 2 months ago  Fatigue @ rest : yes  Primarily motivational fatigue: yes   PMH/ FH of thyroid disease: no  Insomnia  Onset: 2 months ago  Pattern: constant  Difficulty going to sleep: yes  Frequent awakening: yes  Early awakening: yes  Nightmares: no  Abnormal leg movement: no  Snoring: no  Apnea: no  Risk factors/sleep hygiene:  Stimulants: no  Alcohol intake: no  Reading, watching TV, eating @ bedtime: TV before bedtime  Daytime naps: no  Stress/anxiety: yes, due to wife's health problems  Work/travel factors: none  Impact:  Daytime hypersomnolence: no  Motor vehicle accident/motor dysfunction: none  Treatment to date/efficacy: increased dose of klonipin, helps at times  ?  ?  Review of Systems  Fever/ chills : no  Night sweats:  no  Vision changes ( blurred/ double/ loss): no  Hoarseness or swallowing dysfunction: no  Bowel changes( constipation/ diarrhea): no  Weight change: 23 lb weight loss in 2-3 mo  Exertional chest pain: no  Dyspnea on exertion: no  Cough: no  Hemoptysis: no  New medications: no  Leg swelling: no  Orthopnea: no  PND: no  Melena/ rectal bleeding: no  Adenopathy: no  Severe snoring:no  Daytime sleepiness: yes  Skin / hair / nail changes: no  Temperature intolerance( heat/ cold) :no  Feeling depressed: yes, started 2-3 mo ago  Anhedonia: yes  Altered appetite: no  Abnormal bruising / bleeding or enlarged lymph nodes: no  Anxiety: yes, about his own and wife's health, going to retirement home  denies lightheadedness or palpitations  05/13/11 TSH was therapeutic and CBC and differential was normal     Review of Systems  Fever/ chills : no  Night sweats: no  Vision changes ( blurred/ double/ loss): no  Hoarseness or swallowing dysfunction: no  Bowel changes( constipation/ diarrhea): no    Exertional chest pain: no  Dyspnea on exertion: no  Cough: no  Hemoptysis: no  New medications: no  Leg swelling: no  Orthopnea: no  PND: no  Melena/ rectal bleeding: no  Adenopathy: no  Severe snoring:no  Daytime sleepiness: yes  Skin / hair / nail changes: no  Temperature intolerance( heat/ cold) :no  Feeling depressed: yes, started 2-3 mo ago  Anhedonia: yes  Altered appetite: no  Abnormal bruising / bleeding or enlarged lymph nodes: no  Anxiety: yes, about his own and wife's health, going to retirement home  denies lightheadedness or palpitations      Objective:   Physical Exam Gen.: Thin but well-nourished; in no acute distress.Appears younger than stated age  Eyes: Extraocular motion intact; no lid lag or proptosis Neck: full ROM ; thyroid normal Heart: Normal rhythm ;slow rate without significant murmur, gallop, or extra heart sounds Lungs: Chest clear to auscultation  without rales,rales, wheezes Neuro:Deep tendon reflexes are equal and within normal limits; no tremor  Skin: Warm and dry without significant lesions or rashes; no onycholysis LA: none in neck or axillae Psych: Normally communicative and interactive; mood or affect slightly flat clinically.           Assessment & Plan:  #1 neurotransmitter deficiency, situational.The pathophysiology of neurotransmitter deficiency was discussed along with the benefits and potential adverse effects of SSRI therapy.   #2 hypertension, suboptimal blood pressure control. Once and monitor the blood pressure was stress management is pursued. If it stays up low-dose amlodipine will be prescribed. We want to stay away from other calcium channel blockers or increasing his beta blocker because of his bradycardia   Plan: See orders and recommendations

## 2011-08-22 ENCOUNTER — Telehealth: Payer: Self-pay | Admitting: *Deleted

## 2011-08-22 NOTE — Telephone Encounter (Signed)
Pt reports that he has been having some issue with sleeping and would like to know if he can get a Rx for trazodone to help with this issue. Pt use [facility]. Can call Pt at home [phone]. Marland Kitchen.Please advise

## 2011-08-22 NOTE — Telephone Encounter (Signed)
He is on  Clonazepam QHS prn; this cannot be taken with trazodone. If he wants to switch to trazodone we would have to wean the clonazepam and then gradually titrate trazodone. I would recommend an office visit with all medications, if he does not  want to stay on Clonazepam.

## 2011-08-22 NOTE — Telephone Encounter (Signed)
Pt states that he has been taking the clonazepam in the morning but will start taking it at night to see how it works and if it does not help he will call back for OV.

## 2011-12-09 ENCOUNTER — Ambulatory Visit (INDEPENDENT_AMBULATORY_CARE_PROVIDER_SITE_OTHER): Payer: Medicare Other | Admitting: Ophthalmology

## 2011-12-09 DIAGNOSIS — H3530 Unspecified macular degeneration: Secondary | ICD-10-CM

## 2011-12-09 DIAGNOSIS — H35039 Hypertensive retinopathy, unspecified eye: Secondary | ICD-10-CM

## 2011-12-09 DIAGNOSIS — H43819 Vitreous degeneration, unspecified eye: Secondary | ICD-10-CM

## 2011-12-09 DIAGNOSIS — I10 Essential (primary) hypertension: Secondary | ICD-10-CM

## 2011-12-09 DIAGNOSIS — H35349 Macular cyst, hole, or pseudohole, unspecified eye: Secondary | ICD-10-CM

## 2012-03-09 ENCOUNTER — Other Ambulatory Visit: Payer: Self-pay | Admitting: Internal Medicine

## 2012-03-10 NOTE — Telephone Encounter (Signed)
Lipid/Hep 272.4/995.20  

## 2012-04-07 ENCOUNTER — Encounter: Payer: Self-pay | Admitting: Gastroenterology

## 2012-04-15 ENCOUNTER — Telehealth: Payer: Self-pay | Admitting: Internal Medicine

## 2012-04-15 DIAGNOSIS — T887XXA Unspecified adverse effect of drug or medicament, initial encounter: Secondary | ICD-10-CM

## 2012-04-15 DIAGNOSIS — E785 Hyperlipidemia, unspecified: Secondary | ICD-10-CM

## 2012-04-15 MED ORDER — PRAVASTATIN SODIUM 20 MG PO TABS: 20.0000 mg | ORAL_TABLET | Freq: Every morning | ORAL | Status: DC

## 2012-04-15 MED ORDER — METOPROLOL SUCCINATE ER 50 MG PO TB24: 50.0000 mg | ORAL_TABLET | Freq: Every day | ORAL | Status: DC

## 2012-04-15 NOTE — Telephone Encounter (Signed)
REFILLS ON  METOPROLOL ER 50 MG # 90  PRAVASTATIN 20 MG #90

## 2012-04-15 NOTE — Telephone Encounter (Signed)
RX's sent electronically  

## 2012-06-18 ENCOUNTER — Ambulatory Visit (INDEPENDENT_AMBULATORY_CARE_PROVIDER_SITE_OTHER): Payer: Medicare Other | Admitting: Internal Medicine

## 2012-06-18 ENCOUNTER — Encounter: Payer: Self-pay | Admitting: Internal Medicine

## 2012-06-18 VITALS — BP 132/80 | HR 76 | Resp 12 | Wt 138.0 lb

## 2012-06-18 DIAGNOSIS — D638 Anemia in other chronic diseases classified elsewhere: Secondary | ICD-10-CM

## 2012-06-18 DIAGNOSIS — Z23 Encounter for immunization: Secondary | ICD-10-CM

## 2012-06-18 DIAGNOSIS — F4321 Adjustment disorder with depressed mood: Secondary | ICD-10-CM

## 2012-06-18 DIAGNOSIS — F329 Major depressive disorder, single episode, unspecified: Secondary | ICD-10-CM | POA: Insufficient documentation

## 2012-06-18 DIAGNOSIS — H3530 Unspecified macular degeneration: Secondary | ICD-10-CM | POA: Insufficient documentation

## 2012-06-18 DIAGNOSIS — H9190 Unspecified hearing loss, unspecified ear: Secondary | ICD-10-CM

## 2012-06-18 DIAGNOSIS — I10 Essential (primary) hypertension: Secondary | ICD-10-CM

## 2012-06-18 DIAGNOSIS — D649 Anemia, unspecified: Secondary | ICD-10-CM

## 2012-06-18 DIAGNOSIS — H9193 Unspecified hearing loss, bilateral: Secondary | ICD-10-CM

## 2012-06-18 DIAGNOSIS — E785 Hyperlipidemia, unspecified: Secondary | ICD-10-CM

## 2012-06-18 LAB — CBC WITH DIFFERENTIAL/PLATELET
Basophils Relative: 0.3 % (ref 0.0–3.0)
Hemoglobin: 15.1 g/dL (ref 13.0–17.0)
Lymphocytes Relative: 18.2 % (ref 12.0–46.0)
Monocytes Relative: 7.7 % (ref 3.0–12.0)
Neutro Abs: 6.8 10*3/uL (ref 1.4–7.7)
RBC: 5.18 Mil/uL (ref 4.22–5.81)

## 2012-06-18 LAB — HEPATIC FUNCTION PANEL
AST: 20 U/L (ref 0–37)
Albumin: 3.9 g/dL (ref 3.5–5.2)
Alkaline Phosphatase: 75 U/L (ref 39–117)
Total Protein: 7.5 g/dL (ref 6.0–8.3)

## 2012-06-18 LAB — BASIC METABOLIC PANEL
GFR: 69 mL/min (ref >60.00)
Potassium: 5 mEq/L (ref 3.5–5.1)
Sodium: 137 mEq/L (ref 135–145)

## 2012-06-18 LAB — VITAMIN B12: Vitamin B-12: 578 pg/mL (ref 211–911)

## 2012-06-18 LAB — TSH: TSH: 0.63 u[IU]/mL (ref 0.35–5.50)

## 2012-06-18 MED ORDER — METOPROLOL SUCCINATE ER 50 MG PO TB24: 50.0000 mg | ORAL_TABLET | Freq: Every day | ORAL | Status: DC

## 2012-06-18 NOTE — Assessment & Plan Note (Signed)
CBC and differential and B12 levels will be checked.

## 2012-06-18 NOTE — Progress Notes (Signed)
Subjective:    Patient ID: Gerald [name], male    DOB: 1925-05-25, 79 y.o.   MRN: [id number]  HPI  He states his major problems are anxiety and depression related to his wife's health issues. He states his appetite is good; but he has difficulty sleeping. He states that he is cooking his own meals. He also states he is compliant with his statin and blood pressure medicines.  Correspondence from Clarene Essex, health care power of attorney dated 06/18/12 and Billey Chang, social worker from [facility] of the same date were reviewed. These  documents were scanned for entry into the chart under Media    Review of Systems  HYPERTENSION: Disease Monitoring:no Chest pain, palpitations- no       Dyspnea- no Lightheadedness,Syncope- no   Edema- no   HYPERLIPIDEMIA: Disease Monitoring: See symptoms for Hypertension Abd pain, bowel changes- some constipation   Muscle aches- no  Polyuria/phagia/dipsia- no       Visual problems- no     Objective:   Physical Exam Gen.: Thin but adequately nourished in appearance. Alert, appropriate and cooperative throughout exam.Appears younger than stated age  Head: Normocephalic without obvious abnormalities  Eyes: No corneal or conjunctival inflammation noted.  Vision grossly decreased even  with lenses Ears: External  ear exam reveals no significant lesions or deformities. Canals clear .TMs scarred. Hearing is grossly decreased bilaterally . Nose: External nasal exam reveals no deformity or inflammation. Nasal mucosa are pink and moist.  Mouth: Oral mucosa and oropharynx reveal no lesions or exudates. Teeth in good repair. Neck: No deformities, masses, or tenderness noted.  Thyroid normal. Lungs: Normal respiratory effort; chest expands symmetrically. Lungs are clear to auscultation without rales, wheezes, or increased work of breathing. Heart: Normal rate and rhythm. Normal S1 and S2. No gallop, click, or rub. S4 w/o murmur. Abdomen: Bowel  sounds normal; abdomen soft and nontender. No masses, organomegaly or hernias noted.                                Musculoskeletal/extremities: There is some asymmetry of the posterior thoracic musculature suggesting occult scoliosis. No clubbing, cyanosis, edema, or significant extremity  deformity noted. Tone & strength  Normal. Joints normal . Nail health good. Able to lie down & sit up w/o help.  Vascular: Carotid & radial artery  pulses are full and equal. Decreased dorsalis pedis and  posterior tibial.No bruits present. Neurologic: see MMSE. Deep tendon reflexes symmetrical and normal.      Skin: Intact without suspicious lesions or rashes. Lymph: No cervical, axillary lymphadenopathy present. Psych: Mood and affect are normal. Normally interactive              Mini-Mental Status exam completed. He missed one of 3 recall items. His performance involving  reading was dramatically affected by his macular degeneration even with lenses. Clock drawing test was completed well.   He was able to name 9 animals in 60 seconds; on several occasions there was repetition of animal.                                                                Assessment & Plan:  See Current Assessment & Plan in  Problem List under specific Diagnosis

## 2012-06-18 NOTE — Assessment & Plan Note (Signed)
Fasting lipids and hepatic panel will be checked. Pravastatin will not be renewed until those results are reviewed

## 2012-06-18 NOTE — Patient Instructions (Addendum)
Share results with all non [facility] medical staff seen  

## 2012-06-18 NOTE — Assessment & Plan Note (Signed)
Blood pressure control appears to be adequate. Renal function will be checked

## 2012-06-18 NOTE — Assessment & Plan Note (Signed)
I have reassured Gerald [name] that I found no evidence of dementia. I stated I feel he's depressed related to the sensory deficits as well as his wife's health issues.  I expressed concerns about potential health/life risk related to the sensory deficits. I recommended consideration of relocating to retirement facility where he could receive adequate support.  I expressed that his present limitations and its burden on family and friends was becoming a significant issue  His response was appropriate; he expressed desire to remain in his home as long as possible "as anyone would"

## 2012-06-22 ENCOUNTER — Ambulatory Visit: Payer: Medicare Other

## 2012-06-22 DIAGNOSIS — R7309 Other abnormal glucose: Secondary | ICD-10-CM

## 2012-07-30 ENCOUNTER — Other Ambulatory Visit: Payer: Self-pay | Admitting: Internal Medicine

## 2012-10-21 ENCOUNTER — Encounter: Payer: Self-pay | Admitting: Internal Medicine

## 2012-10-21 ENCOUNTER — Ambulatory Visit (INDEPENDENT_AMBULATORY_CARE_PROVIDER_SITE_OTHER): Payer: Medicare Other | Admitting: Internal Medicine

## 2012-10-21 VITALS — BP 120/77 | HR 86 | Temp 98.7°F | Wt 142.4 lb

## 2012-10-21 DIAGNOSIS — IMO0001 Reserved for inherently not codable concepts without codable children: Secondary | ICD-10-CM

## 2012-10-21 DIAGNOSIS — H9190 Unspecified hearing loss, unspecified ear: Secondary | ICD-10-CM

## 2012-10-21 DIAGNOSIS — H9193 Unspecified hearing loss, bilateral: Secondary | ICD-10-CM

## 2012-10-21 DIAGNOSIS — E785 Hyperlipidemia, unspecified: Secondary | ICD-10-CM

## 2012-10-21 DIAGNOSIS — I10 Essential (primary) hypertension: Secondary | ICD-10-CM

## 2012-10-21 LAB — MICROALBUMIN / CREATININE URINE RATIO: Creatinine,U: 221.8 mg/dL

## 2012-10-21 LAB — HEMOGLOBIN A1C: Hgb A1c MFr Bld: 8.6 % — ABNORMAL HIGH (ref 4.6–6.5)

## 2012-10-21 MED ORDER — PRAVASTATIN SODIUM 20 MG PO TABS: ORAL_TABLET | ORAL | Status: DC

## 2012-10-21 MED ORDER — METOPROLOL SUCCINATE ER 50 MG PO TB24: ORAL_TABLET | ORAL | Status: DC

## 2012-10-21 NOTE — Patient Instructions (Addendum)

## 2012-10-21 NOTE — Assessment & Plan Note (Signed)
TSH 

## 2012-10-21 NOTE — Progress Notes (Signed)
  Subjective:    Patient ID: Gerald [name], male    DOB: 1925-02-16, 79 y.o.   MRN: [id number]  HPI   He returns for followup of hypertension, dyslipidemia and diabetes. He continues to live by himself and eats "twice a day". This includes fast foods and sweets snacks; he states "less sweets,potatoes, & bread".  There is no monitor of blood pressure or sugar at home.  His most recent A1c was 8.4% on 06/22/12 which we associated with an average sugar 194 and long-term risk of 68%.  No hypoglycemia reported Last ophthalmologic examination 4 mos ago revealed no retinopathy. No active podiatry assessment on record. Exercise  Is lifting hand weights bid. The most recent lipids  06/18/12  revealed LDL 97 ; HDL 38.1 ; and triglycerides  97  . There is medical compliance with the statin.  No  medication adverse effects suggested    Review of Systems   Constitutional: No  significant weight change;  excess fatigue Eyes: No  blurred vision;  double vision ; loss of vision Cardiovascular: no chest pain ;palpitations; racing; irregular rhythm ;syncope; claudication ; edema  Respiratory: No exertional dyspnea;  paroxysmal nocturnal dyspnea Musculoskeletal: No myalgias or muscle cramping  Dermatologic: No non healing  Lesions; change in color or temperature of skin Neurologic: No  limb weakness;  numbness or tingling;  burning Endocrine: No change in hair, skin, nails. No excessive thirst; excessive hunger;  excessive urination       Objective:   Physical Exam Gen.: Adequately nourished in appearance. Alert, appropriate and cooperative throughout exam. Head: Normocephalic without obvious abnormalities  Eyes: No corneal or conjunctival inflammation noted.  Ears:  Hearing is grossly decreased. Nose: External nasal exam reveals no deformity or inflammation. Nasal mucosa are pink and moist. No lesions or exudates noted.  Mouth: Oral mucosa and oropharynx reveal no lesions or exudates. Teeth in  fair repair Neck: No deformities, masses, or tenderness noted.  Thyroid normal Lungs: Normal respiratory effort; chest expands symmetrically. Lungs are clear to auscultation without rales, wheezes, or increased work of breathing. Decreased BS Heart: Normal rate and rhythm. Normal S1 and S2. No gallop, click, or rub. S4 w/o murmur. Abdomen: Bowel sounds normal; abdomen soft and nontender. No masses, organomegaly or hernias noted.                                   Musculoskeletal/extremities:  No clubbing, cyanosis, edema, or significant extremity  deformity noted. Range of motion normal .Tone & strength  Normal. Joints normal. Nail health good. Able to lie down & sit up w/o help.  Vascular: Carotid, radial artery, dorsalis pedis and  posterior tibial pulses are full and equal. No bruits present. Neurologic: Alert and oriented x3. Deep tendon reflexes symmetrical and normal.         Skin: Intact without suspicious lesions or rashes. Lymph: No cervical, axillary lymphadenopathy present. Psych: Mood and affect are normal. Normally interactive but limited by hearing deficit                                                      Assessment & Plan:  See Current Assessment & Plan in Problem List under specific Diagnosis

## 2012-10-21 NOTE — Assessment & Plan Note (Signed)
Audiology referral recommended

## 2012-10-21 NOTE — Assessment & Plan Note (Signed)
Controlled  no change

## 2012-10-21 NOTE — Assessment & Plan Note (Signed)
A1c & urine microalbumin  

## 2012-10-22 ENCOUNTER — Telehealth: Payer: Self-pay | Admitting: *Deleted

## 2012-10-22 NOTE — Progress Notes (Signed)
Called patient and his wife stated that he was sick and to call back later.

## 2012-10-25 ENCOUNTER — Encounter: Payer: Self-pay | Admitting: *Deleted

## 2012-10-25 NOTE — Telephone Encounter (Signed)
error 

## 2012-10-25 NOTE — Progress Notes (Signed)
Letter mailed to patient.

## 2012-10-27 ENCOUNTER — Encounter: Payer: Self-pay | Admitting: Internal Medicine

## 2012-11-08 ENCOUNTER — Encounter: Payer: Self-pay | Admitting: Internal Medicine

## 2012-11-09 ENCOUNTER — Ambulatory Visit: Payer: Medicare Other

## 2012-11-09 DIAGNOSIS — Z7189 Other specified counseling: Secondary | ICD-10-CM

## 2012-11-09 NOTE — Patient Instructions (Signed)
Patient in the office for diabetic education with daughter. Patient stated he has a glucometer at home but does not use it because he can't see well. I spoke with pt about the importance of monitoring his blood sugars at home and following a diet low in sugar and carbohydrates. I explained to the patient that with his A1c currently being 8.6 he is at high risk for heart attack (70%) and that his risks for diabetes complications are also greatly increased. I spoke with patient and his daughter about beginning levemir insulin but pt refused stating " I can't do it because I can't see it." I asked the patients daughter if any family members could help with medication management and she replied "no, we all have to work." I went on the explain that Levemir was only administered once daily but she still declined. I spoke with the patient and explained that the only option for treatment of his diabetes was insulin and if was unable to do this himself, then he and his family needed to come up with a plan to help make this happen. Patient and his family will be discussing options for assisted living for this patient in order for him to comply with his mediations and blood sugar monitoring. This was discussed with the provider so that he was also aware of the situation.

## 2012-11-09 NOTE — Progress Notes (Signed)
Patient has appt today for diabetes education.

## 2012-11-12 ENCOUNTER — Emergency Department (HOSPITAL_COMMUNITY)
Admission: EM | Admit: 2012-11-12 | Discharge: 2012-11-12 | Disposition: A | Discharge status: Home / Self Care | Payer: Medicare Other | Attending: Emergency Medicine | Admitting: Emergency Medicine

## 2012-11-12 ENCOUNTER — Telehealth: Payer: Self-pay | Admitting: General Practice

## 2012-11-12 ENCOUNTER — Encounter (HOSPITAL_COMMUNITY): Payer: Self-pay | Admitting: Emergency Medicine

## 2012-11-12 DIAGNOSIS — Z87891 Personal history of nicotine dependence: Secondary | ICD-10-CM | POA: Insufficient documentation

## 2012-11-12 DIAGNOSIS — E860 Dehydration: Secondary | ICD-10-CM | POA: Insufficient documentation

## 2012-11-12 DIAGNOSIS — E119 Type 2 diabetes mellitus without complications: Secondary | ICD-10-CM | POA: Insufficient documentation

## 2012-11-12 DIAGNOSIS — I10 Essential (primary) hypertension: Secondary | ICD-10-CM | POA: Insufficient documentation

## 2012-11-12 DIAGNOSIS — Z79899 Other long term (current) drug therapy: Secondary | ICD-10-CM | POA: Insufficient documentation

## 2012-11-12 DIAGNOSIS — Z7982 Long term (current) use of aspirin: Secondary | ICD-10-CM | POA: Insufficient documentation

## 2012-11-12 DIAGNOSIS — R197 Diarrhea, unspecified: Secondary | ICD-10-CM | POA: Insufficient documentation

## 2012-11-12 DIAGNOSIS — Z862 Personal history of diseases of the blood and blood-forming organs and certain disorders involving the immune mechanism: Secondary | ICD-10-CM | POA: Insufficient documentation

## 2012-11-12 DIAGNOSIS — Z951 Presence of aortocoronary bypass graft: Secondary | ICD-10-CM | POA: Insufficient documentation

## 2012-11-12 DIAGNOSIS — R739 Hyperglycemia, unspecified: Secondary | ICD-10-CM

## 2012-11-12 DIAGNOSIS — Z8669 Personal history of other diseases of the nervous system and sense organs: Secondary | ICD-10-CM | POA: Insufficient documentation

## 2012-11-12 LAB — URINE MICROSCOPIC-ADD ON

## 2012-11-12 LAB — URINALYSIS, ROUTINE W REFLEX MICROSCOPIC
Bilirubin Urine: NEGATIVE
Glucose, UA: >1000 mg/dL — AB
Hgb urine dipstick: NEGATIVE
Nitrite: NEGATIVE
Specific Gravity, Urine: 1.023 (ref 1.005–1.030)
Urobilinogen, UA: 1 mg/dL (ref 0.0–1.0)
pH: 6 (ref 5.0–8.0)

## 2012-11-12 LAB — CBC WITH DIFFERENTIAL/PLATELET
Eosinophils Absolute: 0.1 10*3/uL (ref 0.0–0.7)
Eosinophils Relative: 1 % (ref 0–5)
HCT: 45.2 % (ref 39.0–52.0)
Hemoglobin: 14.7 g/dL (ref 13.0–17.0)
Lymphs Abs: 1.4 10*3/uL (ref 0.7–4.0)
MCH: 28.1 pg (ref 26.0–34.0)
MCHC: 32.5 g/dL (ref 30.0–36.0)
MCV: 86.3 fL (ref 78.0–100.0)
Monocytes Absolute: 0.7 10*3/uL (ref 0.1–1.0)
Monocytes Relative: 10 % (ref 3–12)
Platelets: 247 10*3/uL (ref 150–400)
RBC: 5.24 MIL/uL (ref 4.22–5.81)
WBC: 7 10*3/uL (ref 4.0–10.5)

## 2012-11-12 LAB — COMPREHENSIVE METABOLIC PANEL
BUN: 10 mg/dL (ref 6–23)
Calcium: 8.6 mg/dL (ref 8.4–10.5)
Creatinine, Ser: 1.01 mg/dL (ref 0.50–1.35)
GFR calc Af Amer: 76 mL/min — ABNORMAL LOW (ref >90)
Glucose, Bld: 170 mg/dL — ABNORMAL HIGH (ref 70–99)
Total Protein: 7.3 g/dL (ref 6.0–8.3)

## 2012-11-12 LAB — GLUCOSE, CAPILLARY: Glucose-Capillary: 172 mg/dL — ABNORMAL HIGH (ref 70–99)

## 2012-11-12 MED ORDER — SODIUM CHLORIDE 0.9 % IV SOLN: INTRAVENOUS | Status: DC

## 2012-11-12 MED ORDER — SODIUM CHLORIDE 0.9 % IV BOLUS (SEPSIS)
1000.0000 mL | Freq: Once | INTRAVENOUS | Status: AC
  Administered 2012-11-12: 1000 mL via INTRAVENOUS

## 2012-11-12 NOTE — Telephone Encounter (Signed)
Spoke with pt granddaughter. She advised that they are on a waiting list for a nursing home. Granddaughter also advised that for the past month her Gerald [name] has been sticking toilet paper in his rectum to stop diarrhea. This was not brought up with hopper. Due to elevated a1c and diarrhea, granddaughter was advised to take pt to ED for evaluation. Since we cannot treat in office for dehydration.

## 2012-11-12 NOTE — ED Notes (Signed)
Per pt/family, PCP told pt his A1C was elevated and he needed to be placed on insulin-A1C 8.6-was on oral DM meds but was taken off years ago

## 2012-11-12 NOTE — ED Provider Notes (Signed)
CSN: [id number]     Arrival date & time 11/12/12  1656 History   First MD Initiated Contact with Patient 11/12/12 1711     Chief Complaint  Patient presents with  . elevated A1C    (Consider location/radiation/quality/duration/timing/severity/associated sxs/prior Treatment) The history is provided by the patient and a relative.   patient here with possible hyperglycemia and dehydration. Seen his Dr. this week and had an elevated A1c level and was restarted on insulin pump to do to his hearing loss he cannot do this. He does note some polyuria and polydipsia but denies any syncope or near-syncope. Denies any weakness. Has had chronic diarrhea for one month which has not had any blood in characterizes as loose stools. He has been using toilet paper taper in his anus for this. Patient denies any abdominal or chest pain. Notes a good appetite. Does have a history of macular degeneration and is currently on the waiting list for nursing home placement  Past Medical History  Diagnosis Date  . Hypertension   . Diabetes mellitus   . Anemia   . Stricture     PMH OF  . Macular degeneration     Dr Ashley Royalty   Past Surgical History  Procedure Laterality Date  . Lumbar laminectomy  1996  . Inguinal hernia repair  1998    Bilateral  . Esophageal dilation      x 1  . Macular degeneration surgery    . Cataract extraction      OD  . Colonoscopy w/ polypectomy  2009  . Coronary artery bypass graft     Family History  Problem Relation Age of Onset  . Colon cancer Brother   . Arthritis Father   . Hypertension Mother   . Coronary artery disease Mother   . Stroke Mother 14    ?  Marland Kitchen Heart attack Mother 24    ?  Marland Kitchen Diabetes Paternal Grandmother   . Diabetes Maternal Grandmother    History  Substance Use Topics  . Smoking status: Former Smoker    Quit date: 01/21/1988  . Smokeless tobacco: Not on file     Comment: smoked 1951-1990, up to 2 ppd  . Alcohol Use: No    Review of Systems  All  other systems reviewed and are negative.    Allergies  Review of patient's allergies indicates no known allergies.  Home Medications   Current Outpatient Rx  Name  Route  Sig  Dispense  Refill  . aspirin 325 MG tablet   Oral   Take 325 mg by mouth every evening.          . Cimetidine (ACID REDUCER PO)   Oral   Take 1 tablet by mouth daily.          . fish oil-omega-3 fatty acids 1000 MG capsule   Oral   Take 1 g by mouth 2 (two) times daily.         . fluticasone (FLONASE) 50 MCG/ACT nasal spray   Nasal   Place 2 sprays into the nose 2 (two) times daily as needed. For congestion.         . metoprolol succinate (TOPROL-XL) 50 MG 24 hr tablet      TAKE 1 TABLET BY MOUTH ONCE DAILY   90 tablet   1   . Multiple Vitamin (MULTIVITAMIN) tablet   Oral   Take 1 tablet by mouth daily.           . pravastatin (  PRAVACHOL) 20 MG tablet      TAKE 1 TABLET BY MOUTH OHS   90 tablet   1    BP 136/83  Pulse 78  Temp(Src) 97.9 F (36.6 C) (Oral)  Resp 18  SpO2 100% Physical Exam  Nursing note and vitals reviewed. Constitutional: He is oriented to person, place, and time. He appears well-developed and well-nourished.  Non-toxic appearance. No distress.  HENT:  Head: Normocephalic and atraumatic.  Eyes: Conjunctivae, EOM and lids are normal. Pupils are equal, round, and reactive to light.  Neck: Normal range of motion. Neck supple. No tracheal deviation present. No mass present.  Cardiovascular: Normal rate, regular rhythm and normal heart sounds.  Exam reveals no gallop.   No murmur heard. Pulmonary/Chest: Effort normal and breath sounds normal. No stridor. No respiratory distress. He has no decreased breath sounds. He has no wheezes. He has no rhonchi. He has no rales.  Abdominal: Soft. Normal appearance and bowel sounds are normal. He exhibits no distension. There is no tenderness. There is no rebound and no CVA tenderness.  Musculoskeletal: Normal range of  motion. He exhibits no edema and no tenderness.  Neurological: He is alert and oriented to person, place, and time. He has normal strength. No cranial nerve deficit or sensory deficit. GCS eye subscore is 4. GCS verbal subscore is 5. GCS motor subscore is 6.  Skin: Skin is warm and dry. No abrasion and no rash noted.  Psychiatric: He has a normal mood and affect. His speech is normal and behavior is normal.    ED Course  Procedures (including critical care time) Labs Review Labs Reviewed  CBC WITH DIFFERENTIAL  COMPREHENSIVE METABOLIC PANEL  URINALYSIS, ROUTINE W REFLEX MICROSCOPIC   Imaging Review No results found.  EKG Interpretation   None       MDM  No diagnosis found. Patient given IV fluids here for mild hyperglycemia. He is to followup with his primary care Dr. Monday for further diabetic management    Toy Baker, MD 11/12/12 1946

## 2012-11-25 ENCOUNTER — Other Ambulatory Visit: Payer: Self-pay

## 2012-11-30 ENCOUNTER — Telehealth: Payer: Self-pay | Admitting: *Deleted

## 2012-11-30 NOTE — Telephone Encounter (Signed)
Is he candidate for being followed  [facility] or [facility] involvement prior to entering [facility]?

## 2012-11-30 NOTE — Telephone Encounter (Signed)
Recieved call from patients granddaughter, Gerald [name], stating that [name] is going to [facility] around 12/29/2012. She states that [name] is not on insulin yet, he isn't getting Gerald help he needs and is refusing to stop eating "junk food." Granddaughter would like to know what should be done. Please advise.  **Note from diabetic teaching on 11/09/2012** [name] in Gerald office for diabetic education with [name]. [name] stated he has a glucometer at home but does not use it because he can't see well. I spoke with pt about Gerald importance of monitoring Gerald blood sugars at home and following a diet low in sugar and carbohydrates. I explained to Gerald [name] that with Gerald A1c currently being 8.6 he is at high risk for heart attack (70%) and that Gerald risks for diabetes complications are also greatly increased. I spoke with [name] and Gerald [name] about beginning levemir insulin but pt refused stating " I can't do it because I can't see it." I asked Gerald patients [name] if any family members could help with medication management and she replied "no, we all have to work." I went on Gerald explain that Levemir was only administered once daily but she still declined. I spoke with Gerald [name] and explained that Gerald only option for treatment of Gerald diabetes was insulin and if was unable to do this himself, then he and Gerald family needed to come up with a plan to help make this happen. [name] and Gerald family will be discussing options for assisted living for this [name] in order for him to comply with Gerald mediations and blood sugar monitoring. This was discussed with Gerald provider so that he was also aware of Gerald situation.

## 2012-12-01 NOTE — Telephone Encounter (Signed)
Talked with [facility]; they will evaluate the information. Faxed form, last OV with labs. Spoke with Waynetta Sandy, Development worker, international aid at [employer] [facility] for granddaughter to return my call.

## 2012-12-01 NOTE — Telephone Encounter (Signed)
Spoke with granddaughter; states that [facility] called to assist but Mr. Latterell will not let anyone come in and help him. Advised granddaughter that if he would not let anyone help and his family will not help him comply that there is not assistance that we are able to give. She states understanding. She is faxing a form from the care facility. He will still be independent living. Ask if they have skilled nursing that will work with him. She states that they do. Advised that if the forms that she needs is the [facility] we will need to see him for an appointment. States understanding.

## 2012-12-09 ENCOUNTER — Ambulatory Visit (INDEPENDENT_AMBULATORY_CARE_PROVIDER_SITE_OTHER): Payer: Self-pay | Admitting: Ophthalmology

## 2012-12-15 ENCOUNTER — Telehealth: Payer: Self-pay | Admitting: *Deleted

## 2012-12-15 DIAGNOSIS — Z0279 Encounter for issue of other medical certificate: Secondary | ICD-10-CM

## 2012-12-15 NOTE — Telephone Encounter (Signed)
Physicians report for independent and supportive living completed and faxed to [facility] at [facility] per Clarene Essex, POA request.

## 2012-12-15 NOTE — Telephone Encounter (Signed)
error 

## 2012-12-16 ENCOUNTER — Encounter: Payer: Self-pay | Admitting: Internal Medicine

## 2012-12-17 DIAGNOSIS — Z0279 Encounter for issue of other medical certificate: Secondary | ICD-10-CM

## 2012-12-21 NOTE — Telephone Encounter (Signed)
12/21/2012  Completed form sent to batch, copy sent to billing.  bw

## 2013-01-10 ENCOUNTER — Telehealth: Payer: Self-pay | Admitting: Internal Medicine

## 2013-01-10 NOTE — Telephone Encounter (Signed)
Mia with [facility] is calling to request orders for the patient's insulin and his oral medications. She sent a request about 1 week ago and has not hear back. She states that they are going to be managing his medications while he is at [employer] at [facility] Orders can be sent to Fax#: [phone].

## 2013-01-11 ENCOUNTER — Encounter: Payer: Self-pay | Admitting: *Deleted

## 2013-01-11 NOTE — Telephone Encounter (Signed)
All info printed and faxed to [facility] at [facility]

## 2013-01-11 NOTE — Telephone Encounter (Signed)
Levimir OR Lantus basal insulin should be started at a dose of 12 units. Fasting glucose goal is average of 130 to 150. The insulin to be titrated based on the weekly averages.  His care should be transferred to a physician who attends at that retirement Center.He or his POA can determine which physician they prefer  Please send a copy of the last office visit on the med list, and any glucometer teaching notes you may. Thank you

## 2013-01-17 ENCOUNTER — Telehealth: Payer: Self-pay | Admitting: Internal Medicine

## 2013-01-17 NOTE — Telephone Encounter (Signed)
Mia states they received faxed prescription on a letterhead and the pharmacy will not accept this. See phone note dated 01/10/13. Please re-print rx on rx paper. Fax to [phone] Also needs glucose monitor

## 2013-01-18 ENCOUNTER — Other Ambulatory Visit: Payer: Self-pay | Admitting: *Deleted

## 2013-01-18 MED ORDER — GLUCOSE BLOOD VI STRP: ORAL_STRIP | Status: DC

## 2013-01-18 MED ORDER — INSULIN GLARGINE 100 UNIT/ML SOLOSTAR PEN: PEN_INJECTOR | SUBCUTANEOUS | Status: DC

## 2013-01-18 MED ORDER — ONETOUCH ULTRA 2 W/DEVICE KIT: PACK | Status: AC

## 2013-01-18 MED ORDER — ONETOUCH ULTRASOFT LANCETS MISC: Status: DC

## 2013-01-18 NOTE — Telephone Encounter (Signed)
Gerald [name] of [place] face to face encounter form completed and faxed to [facility] at # [phone]

## 2013-01-18 NOTE — Telephone Encounter (Signed)
Spoke with Gerald [name] with [facility] for [facility]. Advised that we would send the Rx that Gerald [name] needs. Discussed onsite physician for Gerald [name]. Generally the patients retain their own PCP. She does state that they have provider that can see them there if the family consents. Gerald [name] states that she will bring it up to the family if we would like, advised to discuss with family. Gerald [name] also needs new meter, test strips, and lancets. New Rx to be faxed.

## 2013-01-18 NOTE — Telephone Encounter (Signed)
Lantus prescription printed and faxed to [facility] at [facility]. JG//CMA

## 2013-01-21 ENCOUNTER — Telehealth: Payer: Self-pay | Admitting: Internal Medicine

## 2013-01-21 NOTE — Telephone Encounter (Signed)
Orders re-faxed. JG//CMA

## 2013-01-21 NOTE — Telephone Encounter (Signed)
[name] for insulin and glucose machine. She asks that these be re-faxed to  [phone]. Please follow-up.

## 2013-01-21 NOTE — Telephone Encounter (Signed)
01/21/2013  Sent copy to batch, Sent copy to billing.  bw

## 2013-01-28 ENCOUNTER — Telehealth: Payer: Self-pay | Admitting: Internal Medicine

## 2013-01-28 NOTE — Telephone Encounter (Signed)
Danielle with [facility] is calling to let Dr. Alwyn RenHopper know that the patient is refusing home health services at this time.  She also states that his depression score was at a 6 which is a high alert for them to notify his doctor. Please advise.

## 2013-02-01 NOTE — Telephone Encounter (Signed)
He  is supposed to be in a retirement center and should be establishing with physician attending at that facility.

## 2013-02-02 NOTE — Telephone Encounter (Signed)
LM with Relations [facility] at [facility] to get patient referred to another provider.

## 2013-02-02 NOTE — Telephone Encounter (Signed)
LM for Duwayne HeckDanielle with [facility] to [phone]

## 2013-02-16 ENCOUNTER — Encounter: Payer: Self-pay | Admitting: Gastroenterology

## 2013-03-08 ENCOUNTER — Ambulatory Visit: Payer: Medicare Other | Admitting: Physician Assistant

## 2013-03-21 ENCOUNTER — Telehealth: Payer: Self-pay | Admitting: *Deleted

## 2013-03-21 NOTE — Telephone Encounter (Signed)
Past 3 office visits, and lab results faxed to [facility] # [phone] attn: [facility].

## 2013-08-04 ENCOUNTER — Other Ambulatory Visit: Payer: Self-pay | Admitting: Internal Medicine

## 2014-02-15 ENCOUNTER — Other Ambulatory Visit: Payer: Self-pay | Admitting: Internal Medicine

## 2014-02-16 ENCOUNTER — Other Ambulatory Visit: Payer: Self-pay | Admitting: Internal Medicine

## 2014-02-23 ENCOUNTER — Other Ambulatory Visit: Payer: Self-pay | Admitting: Internal Medicine

## 2014-03-13 ENCOUNTER — Other Ambulatory Visit: Payer: Self-pay | Admitting: Internal Medicine

## 2014-04-17 ENCOUNTER — Other Ambulatory Visit: Payer: Self-pay | Admitting: Internal Medicine

## 2014-07-18 ENCOUNTER — Encounter: Payer: Self-pay | Admitting: Gastroenterology

## 2014-07-26 ENCOUNTER — Other Ambulatory Visit: Payer: Self-pay | Admitting: Internal Medicine

## 2014-07-27 NOTE — Telephone Encounter (Signed)
Last seen 10/14 ; would need OV

## 2014-07-27 NOTE — Telephone Encounter (Signed)
Last office visit oct/2014---please advise, thanks 

## 2015-02-01 ENCOUNTER — Other Ambulatory Visit: Payer: Self-pay | Admitting: Internal Medicine

## 2018-12-26 ENCOUNTER — Inpatient Hospital Stay (HOSPITAL_COMMUNITY)
Admission: EM | Admit: 2018-12-26 | Discharge: 2018-12-30 | DRG: 871 | Disposition: A | Discharge status: Home / Self Care | Payer: Medicare Other | Attending: Internal Medicine | Admitting: Internal Medicine

## 2018-12-26 ENCOUNTER — Emergency Department (HOSPITAL_COMMUNITY): Payer: Medicare Other

## 2018-12-26 DIAGNOSIS — I361 Nonrheumatic tricuspid (valve) insufficiency: Secondary | ICD-10-CM | POA: Diagnosis not present

## 2018-12-26 DIAGNOSIS — K319 Disease of stomach and duodenum, unspecified: Secondary | ICD-10-CM | POA: Diagnosis present

## 2018-12-26 DIAGNOSIS — Z8249 Family history of ischemic heart disease and other diseases of the circulatory system: Secondary | ICD-10-CM

## 2018-12-26 DIAGNOSIS — E039 Hypothyroidism, unspecified: Secondary | ICD-10-CM | POA: Diagnosis present

## 2018-12-26 DIAGNOSIS — Z951 Presence of aortocoronary bypass graft: Secondary | ICD-10-CM | POA: Diagnosis not present

## 2018-12-26 DIAGNOSIS — Z79899 Other long term (current) drug therapy: Secondary | ICD-10-CM

## 2018-12-26 DIAGNOSIS — A419 Sepsis, unspecified organism: Principal | ICD-10-CM | POA: Diagnosis present

## 2018-12-26 DIAGNOSIS — Z7901 Long term (current) use of anticoagulants: Secondary | ICD-10-CM

## 2018-12-26 DIAGNOSIS — K921 Melena: Secondary | ICD-10-CM | POA: Diagnosis present

## 2018-12-26 DIAGNOSIS — K2091 Esophagitis, unspecified with bleeding: Secondary | ICD-10-CM | POA: Diagnosis present

## 2018-12-26 DIAGNOSIS — R195 Other fecal abnormalities: Secondary | ICD-10-CM | POA: Diagnosis not present

## 2018-12-26 DIAGNOSIS — R778 Other specified abnormalities of plasma proteins: Secondary | ICD-10-CM | POA: Diagnosis present

## 2018-12-26 DIAGNOSIS — Z8261 Family history of arthritis: Secondary | ICD-10-CM

## 2018-12-26 DIAGNOSIS — H3530 Unspecified macular degeneration: Secondary | ICD-10-CM | POA: Diagnosis present

## 2018-12-26 DIAGNOSIS — K2971 Gastritis, unspecified, with bleeding: Secondary | ICD-10-CM | POA: Diagnosis not present

## 2018-12-26 DIAGNOSIS — K209 Esophagitis, unspecified without bleeding: Secondary | ICD-10-CM | POA: Diagnosis not present

## 2018-12-26 DIAGNOSIS — K259 Gastric ulcer, unspecified as acute or chronic, without hemorrhage or perforation: Secondary | ICD-10-CM | POA: Diagnosis not present

## 2018-12-26 DIAGNOSIS — K5710 Diverticulosis of small intestine without perforation or abscess without bleeding: Secondary | ICD-10-CM | POA: Diagnosis present

## 2018-12-26 DIAGNOSIS — E1110 Type 2 diabetes mellitus with ketoacidosis without coma: Secondary | ICD-10-CM | POA: Diagnosis present

## 2018-12-26 DIAGNOSIS — Z87891 Personal history of nicotine dependence: Secondary | ICD-10-CM

## 2018-12-26 DIAGNOSIS — R197 Diarrhea, unspecified: Secondary | ICD-10-CM | POA: Diagnosis present

## 2018-12-26 DIAGNOSIS — Z66 Do not resuscitate: Secondary | ICD-10-CM | POA: Diagnosis present

## 2018-12-26 DIAGNOSIS — Z823 Family history of stroke: Secondary | ICD-10-CM

## 2018-12-26 DIAGNOSIS — K449 Diaphragmatic hernia without obstruction or gangrene: Secondary | ICD-10-CM | POA: Diagnosis present

## 2018-12-26 DIAGNOSIS — Z20828 Contact with and (suspected) exposure to other viral communicable diseases: Secondary | ICD-10-CM | POA: Diagnosis present

## 2018-12-26 DIAGNOSIS — R000 Tachycardia, unspecified: Secondary | ICD-10-CM | POA: Diagnosis present

## 2018-12-26 DIAGNOSIS — Z7982 Long term (current) use of aspirin: Secondary | ICD-10-CM | POA: Diagnosis not present

## 2018-12-26 DIAGNOSIS — N179 Acute kidney failure, unspecified: Secondary | ICD-10-CM | POA: Diagnosis present

## 2018-12-26 DIAGNOSIS — Z794 Long term (current) use of insulin: Secondary | ICD-10-CM | POA: Diagnosis not present

## 2018-12-26 DIAGNOSIS — K922 Gastrointestinal hemorrhage, unspecified: Secondary | ICD-10-CM | POA: Diagnosis present

## 2018-12-26 DIAGNOSIS — I10 Essential (primary) hypertension: Secondary | ICD-10-CM | POA: Diagnosis present

## 2018-12-26 DIAGNOSIS — F0390 Unspecified dementia without behavioral disturbance: Secondary | ICD-10-CM | POA: Diagnosis present

## 2018-12-26 DIAGNOSIS — D62 Acute posthemorrhagic anemia: Secondary | ICD-10-CM | POA: Diagnosis present

## 2018-12-26 DIAGNOSIS — T18128A Food in esophagus causing other injury, initial encounter: Secondary | ICD-10-CM | POA: Diagnosis not present

## 2018-12-26 DIAGNOSIS — T182XXA Foreign body in stomach, initial encounter: Secondary | ICD-10-CM | POA: Diagnosis present

## 2018-12-26 DIAGNOSIS — I480 Paroxysmal atrial fibrillation: Secondary | ICD-10-CM | POA: Diagnosis present

## 2018-12-26 DIAGNOSIS — I4891 Unspecified atrial fibrillation: Secondary | ICD-10-CM

## 2018-12-26 DIAGNOSIS — I340 Nonrheumatic mitral (valve) insufficiency: Secondary | ICD-10-CM | POA: Diagnosis not present

## 2018-12-26 DIAGNOSIS — Z79890 Hormone replacement therapy (postmenopausal): Secondary | ICD-10-CM

## 2018-12-26 DIAGNOSIS — Z833 Family history of diabetes mellitus: Secondary | ICD-10-CM

## 2018-12-26 LAB — LACTIC ACID, PLASMA
Lactic Acid, Venous: 4.8 mmol/L (ref 0.5–1.9)
Lactic Acid, Venous: 3.6 mmol/L (ref 0.5–1.9)

## 2018-12-26 LAB — CBC WITH DIFFERENTIAL/PLATELET
Abs Immature Granulocytes: 0.16 10*3/uL — ABNORMAL HIGH (ref 0.00–0.07)
Basophils Absolute: 0 10*3/uL (ref 0.0–0.1)
Basophils Relative: 0 %
Eosinophils Absolute: 0 10*3/uL (ref 0.0–0.5)
Eosinophils Relative: 0 %
HCT: 36.8 % — ABNORMAL LOW (ref 39.0–52.0)
Hemoglobin: 11.1 g/dL — ABNORMAL LOW (ref 13.0–17.0)
Immature Granulocytes: 1 %
Lymphocytes Relative: 3 %
Lymphs Abs: 0.7 10*3/uL (ref 0.7–4.0)
MCH: 28.2 pg (ref 26.0–34.0)
MCHC: 30.2 g/dL (ref 30.0–36.0)
MCV: 93.4 fL (ref 80.0–100.0)
Monocytes Absolute: 1.5 10*3/uL — ABNORMAL HIGH (ref 0.1–1.0)
Monocytes Relative: 7 %
Neutro Abs: 19.6 10*3/uL — ABNORMAL HIGH (ref 1.7–7.7)
Neutrophils Relative %: 89 %
Platelets: 279 10*3/uL (ref 150–400)
RBC: 3.94 MIL/uL — ABNORMAL LOW (ref 4.22–5.81)
RDW: 15 % (ref 11.5–15.5)
WBC: 21.9 10*3/uL — ABNORMAL HIGH (ref 4.0–10.5)
nRBC: 0 % (ref 0.0–0.2)

## 2018-12-26 LAB — URINALYSIS, ROUTINE W REFLEX MICROSCOPIC
Bilirubin Urine: NEGATIVE
Glucose, UA: 150 mg/dL — AB
Hgb urine dipstick: NEGATIVE
Ketones, ur: 5 mg/dL — AB
Leukocytes,Ua: NEGATIVE
Nitrite: NEGATIVE
Protein, ur: NEGATIVE mg/dL
Specific Gravity, Urine: 1.018 (ref 1.005–1.030)
pH: 5 (ref 5.0–8.0)

## 2018-12-26 LAB — POC SARS CORONAVIRUS 2 AG -  ED: SARS Coronavirus 2 Ag: NEGATIVE

## 2018-12-26 LAB — BASIC METABOLIC PANEL
Anion gap: 12 (ref 5–15)
Anion gap: 9 (ref 5–15)
BUN: 111 mg/dL — ABNORMAL HIGH (ref 8–23)
BUN: 96 mg/dL — ABNORMAL HIGH (ref 8–23)
CO2: 21 mmol/L — ABNORMAL LOW (ref 22–32)
CO2: 19 mmol/L — ABNORMAL LOW (ref 22–32)
Calcium: 7.2 mg/dL — ABNORMAL LOW (ref 8.9–10.3)
Calcium: 6.6 mg/dL — ABNORMAL LOW (ref 8.9–10.3)
Chloride: 106 mmol/L (ref 98–111)
Chloride: 111 mmol/L (ref 98–111)
Creatinine, Ser: 2.37 mg/dL — ABNORMAL HIGH (ref 0.61–1.24)
Creatinine, Ser: 2.13 mg/dL — ABNORMAL HIGH (ref 0.61–1.24)
GFR calc Af Amer: 27 mL/min — ABNORMAL LOW (ref >60)
GFR calc Af Amer: 30 mL/min — ABNORMAL LOW (ref >60)
GFR calc non Af Amer: 23 mL/min — ABNORMAL LOW (ref >60)
GFR calc non Af Amer: 26 mL/min — ABNORMAL LOW (ref >60)
Glucose, Bld: 256 mg/dL — ABNORMAL HIGH (ref 70–99)
Glucose, Bld: 215 mg/dL — ABNORMAL HIGH (ref 70–99)
Potassium: 3.7 mmol/L (ref 3.5–5.1)
Potassium: 3.9 mmol/L (ref 3.5–5.1)
Sodium: 139 mmol/L (ref 135–145)
Sodium: 139 mmol/L (ref 135–145)

## 2018-12-26 LAB — COMPREHENSIVE METABOLIC PANEL
ALT: 20 U/L (ref 0–44)
AST: 24 U/L (ref 15–41)
Albumin: 3.5 g/dL (ref 3.5–5.0)
Alkaline Phosphatase: 47 U/L (ref 38–126)
Anion gap: 18 — ABNORMAL HIGH (ref 5–15)
BUN: 105 mg/dL — ABNORMAL HIGH (ref 8–23)
CO2: 22 mmol/L (ref 22–32)
Calcium: 8.4 mg/dL — ABNORMAL LOW (ref 8.9–10.3)
Chloride: 101 mmol/L (ref 98–111)
Creatinine, Ser: 3.19 mg/dL — ABNORMAL HIGH (ref 0.61–1.24)
GFR calc Af Amer: 19 mL/min — ABNORMAL LOW (ref >60)
GFR calc non Af Amer: 16 mL/min — ABNORMAL LOW (ref >60)
Glucose, Bld: 305 mg/dL — ABNORMAL HIGH (ref 70–99)
Potassium: 4.1 mmol/L (ref 3.5–5.1)
Sodium: 141 mmol/L (ref 135–145)
Total Bilirubin: 0.3 mg/dL (ref 0.3–1.2)
Total Protein: 6.7 g/dL (ref 6.5–8.1)

## 2018-12-26 LAB — HEMOGLOBIN A1C
Hgb A1c MFr Bld: 6.3 % — ABNORMAL HIGH (ref 4.8–5.6)
Mean Plasma Glucose: 134.11 mg/dL

## 2018-12-26 LAB — CREATININE, URINE, RANDOM: Creatinine, Urine: 90.01 mg/dL

## 2018-12-26 LAB — TYPE AND SCREEN
ABO/RH(D): A POS
Antibody Screen: NEGATIVE

## 2018-12-26 LAB — APTT: aPTT: 42 seconds — ABNORMAL HIGH (ref 24–36)

## 2018-12-26 LAB — PROTIME-INR
INR: 1.4 — ABNORMAL HIGH (ref 0.8–1.2)
Prothrombin Time: 16.7 seconds — ABNORMAL HIGH (ref 11.4–15.2)

## 2018-12-26 LAB — TROPONIN I (HIGH SENSITIVITY): Troponin I (High Sensitivity): 202 ng/L (ref <18)

## 2018-12-26 LAB — CBG MONITORING, ED: Glucose-Capillary: 198 mg/dL — ABNORMAL HIGH (ref 70–99)

## 2018-12-26 LAB — ABO/RH: ABO/RH(D): A POS

## 2018-12-26 LAB — BETA-HYDROXYBUTYRIC ACID: Beta-Hydroxybutyric Acid: 0.08 mmol/L (ref 0.05–0.27)

## 2018-12-26 LAB — POC OCCULT BLOOD, ED: Fecal Occult Bld: POSITIVE — AB

## 2018-12-26 LAB — SODIUM, URINE, RANDOM: Sodium, Ur: 15 mmol/L

## 2018-12-26 LAB — LIPASE, BLOOD: Lipase: 35 U/L (ref 11–51)

## 2018-12-26 MED ORDER — INSULIN ASPART 100 UNIT/ML ~~LOC~~ SOLN
0.0000 [IU] | Freq: Every day | SUBCUTANEOUS | Status: DC
  Filled 2018-12-26: qty 0.05

## 2018-12-26 MED ORDER — DILTIAZEM HCL 25 MG/5ML IV SOLN
5.0000 mg | Freq: Once | INTRAVENOUS | Status: AC
  Administered 2018-12-26: 5 mg via INTRAVENOUS
  Filled 2018-12-26: qty 5

## 2018-12-26 MED ORDER — SODIUM CHLORIDE 0.9 % IV SOLN
INTRAVENOUS | Status: DC
  Administered 2018-12-26 – 2018-12-27 (×2): via INTRAVENOUS

## 2018-12-26 MED ORDER — POTASSIUM CHLORIDE 10 MEQ/100ML IV SOLN: 10.0000 meq | INTRAVENOUS | Status: AC

## 2018-12-26 MED ORDER — SODIUM CHLORIDE 0.9 % IV BOLUS
500.0000 mL | Freq: Once | INTRAVENOUS | Status: AC
  Administered 2018-12-26: 14:00:00 500 mL via INTRAVENOUS

## 2018-12-26 MED ORDER — DEXTROSE 50 % IV SOLN: 0.0000 mL | INTRAVENOUS | Status: DC | PRN

## 2018-12-26 MED ORDER — SODIUM CHLORIDE 0.9 % IV SOLN
INTRAVENOUS | Status: DC
  Administered 2018-12-26: 21:00:00 via INTRAVENOUS

## 2018-12-26 MED ORDER — METRONIDAZOLE IN NACL 5-0.79 MG/ML-% IV SOLN
500.0000 mg | Freq: Three times a day (TID) | INTRAVENOUS | Status: DC
  Administered 2018-12-27 – 2018-12-28 (×5): 500 mg via INTRAVENOUS
  Filled 2018-12-26 (×5): qty 100

## 2018-12-26 MED ORDER — SODIUM CHLORIDE 0.9 % IV BOLUS (SEPSIS)
1000.0000 mL | Freq: Once | INTRAVENOUS | Status: AC
  Administered 2018-12-26: 1000 mL via INTRAVENOUS

## 2018-12-26 MED ORDER — METRONIDAZOLE IN NACL 5-0.79 MG/ML-% IV SOLN
500.0000 mg | Freq: Once | INTRAVENOUS | Status: AC
  Administered 2018-12-26: 500 mg via INTRAVENOUS
  Filled 2018-12-26: qty 100

## 2018-12-26 MED ORDER — SODIUM CHLORIDE 0.9 % IV SOLN: INTRAVENOUS | Status: DC

## 2018-12-26 MED ORDER — DILTIAZEM HCL-DEXTROSE 125-5 MG/125ML-% IV SOLN (PREMIX)
5.0000 mg/h | INTRAVENOUS | Status: DC
  Administered 2018-12-26: 5 mg/h via INTRAVENOUS
  Filled 2018-12-26: qty 125

## 2018-12-26 MED ORDER — INSULIN ASPART 100 UNIT/ML ~~LOC~~ SOLN
0.0000 [IU] | Freq: Three times a day (TID) | SUBCUTANEOUS | Status: DC
  Administered 2018-12-27 (×2): 2 [IU] via SUBCUTANEOUS
  Administered 2018-12-28 (×2): 3 [IU] via SUBCUTANEOUS
  Administered 2018-12-29 – 2018-12-30 (×4): 2 [IU] via SUBCUTANEOUS
  Administered 2018-12-30: 7 [IU] via SUBCUTANEOUS
  Filled 2018-12-26: qty 0.09

## 2018-12-26 MED ORDER — INSULIN REGULAR(HUMAN) IN NACL 100-0.9 UT/100ML-% IV SOLN: INTRAVENOUS | Status: DC

## 2018-12-26 MED ORDER — METOPROLOL TARTRATE 25 MG PO TABS
12.5000 mg | ORAL_TABLET | Freq: Two times a day (BID) | ORAL | Status: DC
  Filled 2018-12-26: qty 1

## 2018-12-26 MED ORDER — PANTOPRAZOLE SODIUM 40 MG IV SOLR
40.0000 mg | Freq: Two times a day (BID) | INTRAVENOUS | Status: DC
  Administered 2018-12-26 – 2018-12-28 (×5): 40 mg via INTRAVENOUS
  Filled 2018-12-26 (×5): qty 40

## 2018-12-26 MED ORDER — SODIUM CHLORIDE 0.9 % IV SOLN
2.0000 g | Freq: Once | INTRAVENOUS | Status: AC
  Administered 2018-12-26: 2 g via INTRAVENOUS
  Filled 2018-12-26: qty 20

## 2018-12-26 MED ORDER — DEXTROSE-NACL 5-0.45 % IV SOLN: INTRAVENOUS | Status: DC

## 2018-12-26 MED ORDER — SODIUM CHLORIDE 0.9 % IV BOLUS
500.0000 mL | Freq: Once | INTRAVENOUS | Status: AC
  Administered 2018-12-26: 500 mL via INTRAVENOUS

## 2018-12-26 MED ORDER — SODIUM CHLORIDE 0.9 % IV SOLN
2.0000 g | INTRAVENOUS | Status: DC
  Administered 2018-12-27: 2 g via INTRAVENOUS
  Filled 2018-12-26 (×2): qty 20

## 2018-12-26 NOTE — ED Provider Notes (Signed)
[facility] DEPT Provider Note   CSN: [id number] Arrival date & time: 12/26/18  1240     History   Chief Complaint No chief complaint on file.   HPI Gerald [name] is a 85 y.o. male S1 history of diabetes, hypertension brought in by EMS from [facility] for evaluation of diarrhea that began last night.  Patient was also noted to be febrile last night.  EMS reports that he was febrile at 100.2 on arrival.  He was given Tylenol.  They are unclear if he had any black or tarry stools.  Nursing home reports no Covid outbreaks in the nursing home but does not know if he has had outside visitors.  Patient states that he has not had any abdominal pain.  He reports one episode of vomiting yesterday.  He does not think there is any blood in the vomit.  Patient states that he has not any chest pain or difficulty breathing. Patient is currently on eliquis.   EM LEVEL 5 CAVEAT DUE TO AMS     The history is provided by the patient.    Past Medical History:  Diagnosis Date   Anemia    Diabetes mellitus    Hypertension    Macular degeneration    Dr Zigmund Daniel   Stricture    [facility] OF    Patient Active Problem List   Diagnosis Date Noted   GI bleeding 12/26/2018   Hearing loss of both ears 06/18/2012   Macular degeneration 06/18/2012   Reactive depression (situational) 06/18/2012   UNSPECIFIED CARDIAC DYSRHYTHMIA 12/18/2009   Type II or unspecified type diabetes mellitus with peripheral circulatory disorders, uncontrolled(250.72) 06/01/2008   ANEMIA-NOS 06/01/2008   UNSPECIFIED PREMATURE BEATS 06/01/2008   COLONIC POLYPS, HX OF 06/01/2008   ESOPHAGEAL STRICTURE 05/18/2007   OTHER AND UNSPECIFIED HYPERLIPIDEMIA 05/10/2007   ATHEROSCLEROSIS 04/16/2007   GERD 04/16/2007   ARTHRITIS 29/79/8921   HELICOBACTER PYLORI INFECTION, HX OF 04/16/2007   GASTRITIS 08/12/2006   HIATAL HERNIA 08/12/2006   HYPERTENSION 06/18/2006    Past  Surgical History:  Procedure Laterality Date   CATARACT EXTRACTION     OD   COLONOSCOPY W/ POLYPECTOMY  2009   CORONARY ARTERY BYPASS GRAFT     ESOPHAGEAL DILATION     x 1   INGUINAL HERNIA REPAIR  1998   Bilateral   LUMBAR LAMINECTOMY  1996   Macular Degeneration Surgery          Home Medications    Prior to Admission medications   Medication Sig Start Date End Date Taking? Authorizing Provider  aspirin EC 81 MG tablet Take 81 mg by mouth daily.    [provider]  Blood Glucose Monitoring Suppl (ONE TOUCH ULTRA 2) W/DEVICE KIT Test blood sugar daily. Dx Code: 250.72 01/18/13   Hendricks Limes, MD  Insulin Pen Needle (B-D UF III MINI PEN NEEDLES) 31G X 5 MM MISC USE TO TEST BLOOD SUGARS ICD 10 E11 59 03/13/14   Hendricks Limes, MD  LANTUS SOLOSTAR 100 UNIT/ML Solostar Pen INJECT STARTING DOSE OF 12 UNITS -TIRATEAS NEEDED 04/17/14   Hendricks Limes, MD  metoprolol succinate (TOPROL-XL) 50 MG 24 hr tablet TAKE ONE TABLET EACH DAY 08/04/13   Hendricks Limes, MD  ONE TOUCH ULTRA TEST test strip CHECK BLOOD GLUCOSE (SUGAR) DAILY 02/15/14   Hendricks Limes, MD  [facility] DELICA LANCETS 19E MISC CHECK BLOOD GLUCOSE DAILY E11  59 02/24/14   Hendricks Limes, MD  pravastatin (PRAVACHOL) 20 MG tablet TAKE ONE TABLET AT BEDTIME 08/04/13   Hendricks Limes, MD    Family History Family History  Problem Relation Age of Onset   Colon cancer Brother    Arthritis Father    Hypertension Mother    Coronary artery disease Mother    Stroke Mother 68       ?   Heart attack Mother 50       ?   Diabetes Paternal Grandmother    Diabetes Maternal Grandmother     Social History Social History   Tobacco Use   Smoking status: Former Smoker    Quit date: 01/21/1988    Years since quitting: 30.9   Tobacco comment: smoked 1951-1990, up to 2 ppd  Substance Use Topics   Alcohol use: No   Drug use: No     Allergies   Patient has no known allergies.   Review  of Systems Review of Systems  Constitutional: Positive for fever.  Respiratory: Negative for shortness of breath.   Cardiovascular: Negative for chest pain.  Gastrointestinal: Positive for diarrhea. Negative for abdominal pain, nausea and vomiting.  Genitourinary: Negative for dysuria and hematuria.  Neurological: Negative for headaches.  All other systems reviewed and are negative.    Physical Exam Updated Vital Signs BP 116/63    Pulse 89    Temp 99.6 F (37.6 C) (Rectal)    Resp 18    Ht '5\' 6"'  (1.676 m)    Wt 72.6 kg    SpO2 100%    BMI 25.82 kg/m   Physical Exam Vitals signs and nursing note reviewed. Exam conducted with a chaperone present.  Constitutional:      Appearance: Normal appearance. He is well-developed.  HENT:     Head: Normocephalic and atraumatic.  Eyes:     General: Lids are normal.     Conjunctiva/sclera: Conjunctivae normal.     Pupils: Pupils are equal, round, and reactive to light.  Neck:     Musculoskeletal: Full passive range of motion without pain.  Cardiovascular:     Rate and Rhythm: Regular rhythm. Tachycardia present.     Pulses: Normal pulses.     Heart sounds: Normal heart sounds. No murmur. No friction rub. No gallop.   Pulmonary:     Effort: Pulmonary effort is normal.     Breath sounds: Normal breath sounds.     Comments: Lungs clear to auscultation bilaterally.  Symmetric chest rise.  No wheezing, rales, rhonchi. Abdominal:     Palpations: Abdomen is soft. Abdomen is not rigid.     Tenderness: There is no abdominal tenderness. There is no guarding.     Comments: Abdomen is soft, non-distended, non-tender. No rigidity, No guarding. No peritoneal signs.  Genitourinary:    Rectum: Guaiac result positive.     Comments: The exam was performed with a chaperone present.  Black tarry melena noted around patient's buttock and in his underwear. Musculoskeletal: Normal range of motion.  Skin:    General: Skin is warm and dry.     Capillary  Refill: Capillary refill takes less than 2 seconds.  Neurological:     Mental Status: He is alert and oriented to person, place, and time.  Psychiatric:        Speech: Speech normal.      ED Treatments / Results  Labs (all labs ordered are listed, but only abnormal results are displayed) Labs Reviewed  COMPREHENSIVE METABOLIC PANEL - Abnormal; Notable for the  following components:      Result Value   Glucose, Bld 305 (*)    BUN 105 (*)    Creatinine, Ser 3.19 (*)    Calcium 8.4 (*)    GFR calc non Af Amer 16 (*)    GFR calc Af Amer 19 (*)    Anion gap 18 (*)    All other components within normal limits  CBC WITH DIFFERENTIAL/PLATELET - Abnormal; Notable for the following components:   WBC 21.9 (*)    RBC 3.94 (*)    Hemoglobin 11.1 (*)    HCT 36.8 (*)    Neutro Abs 19.6 (*)    Monocytes Absolute 1.5 (*)    Abs Immature Granulocytes 0.16 (*)    All other components within normal limits  URINALYSIS, ROUTINE W REFLEX MICROSCOPIC - Abnormal; Notable for the following components:   Glucose, UA 150 (*)    Ketones, ur 5 (*)    All other components within normal limits  LACTIC ACID, PLASMA - Abnormal; Notable for the following components:   Lactic Acid, Venous 4.8 (*)    All other components within normal limits  LACTIC ACID, PLASMA - Abnormal; Notable for the following components:   Lactic Acid, Venous 3.6 (*)    All other components within normal limits  APTT - Abnormal; Notable for the following components:   aPTT 42 (*)    All other components within normal limits  PROTIME-INR - Abnormal; Notable for the following components:   Prothrombin Time 16.7 (*)    INR 1.4 (*)    All other components within normal limits  POC OCCULT BLOOD, ED - Abnormal; Notable for the following components:   Fecal Occult Bld POSITIVE (*)    All other components within normal limits  CULTURE, BLOOD (ROUTINE X 2)  CULTURE, BLOOD (ROUTINE X 2)  URINE CULTURE  SARS CORONAVIRUS 2 (TAT 6-24 HRS)   GASTROINTESTINAL PANEL BY PCR, STOOL (REPLACES STOOL CULTURE)  LIPASE, BLOOD  POC SARS CORONAVIRUS 2 AG -  ED  TYPE AND SCREEN  ABO/RH    EKG EKG Interpretation  Date/Time:  Sunday December 26 2018 13:07:48 EST Ventricular Rate:  118 PR Interval:    QRS Duration: 71 QT Interval:  313 QTC Calculation: 439 R Axis:   45 Text Interpretation: Atrial fibrillation with rapid ventricular response Low voltage, extremity leads Non-specific ST-t changes Baseline wander Confirmed by Lajean Saver [id number]) on 12/26/2018 2:19:48 PM   Radiology Ct Abdomen Pelvis Wo Contrast  Result Date: 12/26/2018 CLINICAL DATA:  Diarrhea since yesterday evening. Complains of body aches in loss of appetite EXAM: CT ABDOMEN AND PELVIS WITHOUT CONTRAST TECHNIQUE: Multidetector CT imaging of the abdomen and pelvis was performed following the standard protocol without IV contrast. COMPARISON:  None. FINDINGS: Lower chest: No acute abnormality. Hepatobiliary: No focal liver abnormality is seen. No gallstones, gallbladder wall thickening, or biliary dilatation. Pancreas: Unremarkable. No pancreatic ductal dilatation or surrounding inflammatory changes. Spleen: Normal in size without focal abnormality. Adrenals/Urinary Tract: Normal adrenal glands. Cystic structure within upper pole of the right kidney measures 1.9 cm. This is incompletely characterized without IV contrast. No hydronephrosis or mass identified bilaterally. Urinary bladder appears normal. Stomach/Bowel: Hiatal hernia. Moderate distension of the gastric lumen. No abnormal bowel wall thickening, inflammation or distension identified. The appendix is not confidently identified. No secondary signs however of acute appendicitis. A few scattered colonic diverticula noted without acute inflammation. Vascular/Lymphatic: Aortic atherosclerosis. No aneurysm. No abdominopelvic adenopathy identified. Reproductive: Prostate is unremarkable. Other:  No abdominal wall hernia or  abnormality. No abdominopelvic ascites. Musculoskeletal: Thoracolumbar scoliosis and multi level degenerative disc disease identified. No acute or suspicious osseous findings identified. IMPRESSION: 1. No acute findings within the abdomen or pelvis. 2. Hiatal hernia. 3. Aortic atherosclerosis. The Aortic Atherosclerosis (ICD10-I70.0). Electronically Signed   By: Kerby Moors M.D.   On: 12/26/2018 16:34   Dg Chest Portable 1 View  Result Date: 12/26/2018 CLINICAL DATA:  Fever cough. EXAM: PORTABLE CHEST 1 VIEW COMPARISON:  11/07/2004. FINDINGS: Lung volumes are low. Cardiomediastinal contours are likely stable accounting for low lung volumes. Basilar opacity seen bilaterally. No signs of pleural effusion. Study limited by lordotic positioning with respect to assessment of lung bases. Post median sternotomy for CABG. No acute bone finding. IMPRESSION: 1. Low lung volumes with bibasilar opacity, likely atelectasis. 2. No visible pleural effusion or pneumothorax. Electronically Signed   By: Zetta Bills M.D.   On: 12/26/2018 14:29    Procedures .Critical Care Performed by: Volanda Napoleon, PA-C Authorized by: Volanda Napoleon, PA-C   Critical care provider statement:    Critical care time (minutes):  45   Critical care time was exclusive of: GI bleed.   Critical care was necessary to treat or prevent imminent or life-threatening deterioration of the following conditions:  Sepsis   Critical care was time spent personally by me on the following activities:  Discussions with consultants, evaluation of patient's response to treatment, examination of patient, ordering and performing treatments and interventions, ordering and review of laboratory studies, ordering and review of radiographic studies, pulse oximetry, re-evaluation of patient's condition, obtaining history from patient or surrogate and review of old charts   (including critical care time)  Medications Ordered in ED Medications    diltiazem (CARDIZEM) 125 mg in dextrose 5% 125 mL (1 mg/mL) infusion (5 mg/hr Intravenous New Bag/Given 12/26/18 1740)  0.9 %  sodium chloride infusion (has no administration in time range)  pantoprazole (PROTONIX) injection 40 mg (has no administration in time range)  metoprolol tartrate (LOPRESSOR) tablet 12.5 mg (has no administration in time range)  cefTRIAXone (ROCEPHIN) 1 g in sodium chloride 0.9 % 100 mL IVPB (has no administration in time range)  metroNIDAZOLE (FLAGYL) IVPB 500 mg (has no administration in time range)  sodium chloride 0.9 % bolus 500 mL (0 mLs Intravenous Stopped 12/26/18 1511)  sodium chloride 0.9 % bolus 1,000 mL (0 mLs Intravenous Stopped 12/26/18 1649)  cefTRIAXone (ROCEPHIN) 2 g in sodium chloride 0.9 % 100 mL IVPB (0 g Intravenous Stopped 12/26/18 1607)  metroNIDAZOLE (FLAGYL) IVPB 500 mg (0 mg Intravenous Stopped 12/26/18 1738)  diltiazem (CARDIZEM) injection 5 mg (5 mg Intravenous Given 12/26/18 1517)  diltiazem (CARDIZEM) injection 5 mg (5 mg Intravenous Given 12/26/18 1607)  sodium chloride 0.9 % bolus 500 mL (0 mLs Intravenous Stopped 12/26/18 1738)     Initial Impression / Assessment and Plan / ED Course  I have reviewed the triage vital signs and the nursing notes.  Pertinent labs & imaging results that were available during my care of the patient were reviewed by me and considered in my medical decision making (see chart for details).        85 year old male brought in by [facility] for evaluation of diarrhea and fever.  Initial urine, afebrile, slightly tachycardic.  Vitals stable.  Patient with benign abdominal exam.  Nurses evaluation of getting undressed, she noted black tarry stool around his buttock and in his underwear.  I reviewed this and it  did appear to be melanous.  Fecal occult sent.  Concern for diverticulitis versus GI bleed.  He is on Eliquis.  Also consider viral process.  Plan to check labs.  Fecal occult is positive.  CBC shows  leukocytosis of 21.9.  Hemoglobin is 11.1.  CMP shows BUN of 105, creatinine of 3.19.  Anion gap is 18.  Lactic is elevated at 4.8.  He does have a history of diverticulosis as evidenced by previous colonoscopies done in 2009.  Code sepsis initiated.  Patient given fluids, antibiotics.    I discussed with patient's granddaughter, Jeri Modena.  She states that patient has received most of his medical care from the [facility]-call doctors at [facility].  He has not followed up with a outpatient primary care doctor in several years.  She is unsure why he is on Eliquis.  She does state that patient is a DNR.  I discussed with Peter Congo, nurse at [facility].  She does not know why patient is on Eliquis.  We have not received a full copy of his medical record.  She states she is going to fax it over.  Patient did appear to be in A. fib with RVR.  He was given diltiazem with improvement but then spiked back up into heart rate in the 120s 130s.  Patient started on diltiazem drip with improvement in heart rate in the 70s.  Given concerns with A. fib with RVR, GI bleed and possible infection, patient will be admitted.  CT scan shows no acute findings within the abdomen or pelvis.    Discussed patient with Dr. Talmadge Coventry (Hospitalist). Will accept patient for admission.   Discussed patient with Dr. Bryan Lemma ([facility]).  Agrees with plan for admission and Protonix.  Would like Eliquis held.  We will plan to consult.  Would like patient n.p.o. after midnight in case he would need to do an EGD tomorrow.  Gerald [name] was evaluated in Emergency Department on 12/26/2018 for the symptoms described in the history of present illness. He was evaluated in the context of the global COVID-19 pandemic, which necessitated consideration that the patient might be at risk for infection with the SARS-CoV-2 virus that causes COVID-19. Institutional protocols and algorithms that pertain to the evaluation of patients at risk for COVID-19 are  in a state of rapid change based on information released by regulatory bodies including the CDC and federal and state organizations. These policies and algorithms were followed during the patient's care in the ED.   Portions of this note were generated with [employer]. Dictation errors may occur despite best attempts at proofreading.  Final Clinical Impressions(s) / ED Diagnoses   Final diagnoses:  Gastrointestinal hemorrhage, unspecified gastrointestinal hemorrhage type  Atrial fibrillation with RVR (HCC)  Sepsis, due to unspecified organism, unspecified whether acute organ dysfunction present [facility])    ED Discharge Orders    None       Desma Mcgregor 12/26/18 1839    Lajean Saver, MD 12/27/18 1338

## 2018-12-26 NOTE — ED Notes (Signed)
CRITICAL VALUE STICKER  CRITICAL VALUE: Trop 43  RECEIVER (on-site recipient of call): Jowanda Heeg RN   [place] NOTIFIED: 12/26/18 2223  MESSENGER (representative from lab):  MD NOTIFIED: [place]: 2225

## 2018-12-26 NOTE — ED Notes (Signed)
Condom cath placed on pt 

## 2018-12-26 NOTE — ED Triage Notes (Signed)
Per EMS: Pt from [facility].  Pt c/o of diarrhea since yesterday evening. No c/o of body-aches, loss of appetite. CBG 318 Temp 100.2 1 g tylenol IV 18 L wrist

## 2018-12-26 NOTE — ED Notes (Signed)
Date and time results received: 12/26/18 2:19 PM  Test: Lactic Critical Value: 4.8  Name of Provider Notified: Mendel Ryder PA

## 2018-12-26 NOTE — H&P (Signed)
History and Physical    Gerald [name] YFV:[id number] DOB: [birth year] DOA: 12/26/2018  PCP: Hendricks Limes, MD Patient coming from: [facility] [place]  I have personally briefly reviewed patient's old medical records in [place]  Chief Complaint: sent in from SNF for diarrhea and hematochezia  HPI: Gerald [name] is a 85 y.o. male with medical history significant of anemia, diabetes mellitus, hypertension, diverticulosis, paroxysmal atrial fibrillation on Eliquis was brought in from SNF to ED for fever, diarrhea and hematochezia.  Patient has dementia and is pleasantly confused and most of the history was obtained from the EDP and SNF paperwork.  Pt denies any chest pain or sob. No abdominal pain.    ED Course: On arrival to ED he had low-grade temp of 99.6 was tachycardic with a heart rate of 115/min blood pressure 107/56.  Labs revealed sodium of 141, potassium of 4.1, glucose of 305, BUN of 105, creatinine of 3.1, calcium of 8.4, anion gap of 18, lactic acid of 4.8, WBC of 21.9, hemoglobin of 11 INR of 1.4. CT abd and pelvis does not show any acute findings. Guiac positive stools.   Pt was referred to [facility] service for admission for hematochezia. GI consulted by EDP.   Review of Systems: couldn't be done due to dementia.   Past Medical History:  Diagnosis Date   Anemia    Diabetes mellitus    Hypertension    Macular degeneration    Dr Zigmund Daniel   Stricture    PMH OF    Past Surgical History:  Procedure Laterality Date   CATARACT EXTRACTION     OD   COLONOSCOPY W/ POLYPECTOMY  2009   CORONARY ARTERY BYPASS GRAFT     ESOPHAGEAL DILATION     x 1   INGUINAL HERNIA REPAIR  1998   Bilateral   LUMBAR LAMINECTOMY  1996   Macular Degeneration Surgery     Social history.   reports that he quit smoking about 30 years ago. He does not have any smokeless tobacco history on file. He reports that he does not drink alcohol or use drugs.  No Known Allergies  Family  History  Problem Relation Age of Onset   Colon cancer Brother    Arthritis Father    Hypertension Mother    Coronary artery disease Mother    Stroke Mother 49       ?   Heart attack Mother 12       ?   Diabetes Paternal Grandmother    Diabetes Maternal Grandmother    Pt confused, family history couldn't be reviewed.   Prior to Admission medications   Medication Sig Start Date End Date Taking? Authorizing Provider  aspirin EC 81 MG tablet Take 81 mg by mouth daily.    [provider]  Blood Glucose Monitoring Suppl (ONE TOUCH ULTRA 2) W/DEVICE KIT Test blood sugar daily. Dx Code: 250.72 01/18/13   Hendricks Limes, MD  Insulin Pen Needle (B-D UF III MINI PEN NEEDLES) 31G X 5 MM MISC USE TO TEST BLOOD SUGARS ICD 10 E11 59 03/13/14   Hendricks Limes, MD  LANTUS SOLOSTAR 100 UNIT/ML Solostar Pen INJECT STARTING DOSE OF 12 UNITS -TIRATEAS NEEDED 04/17/14   Hendricks Limes, MD  metoprolol succinate (TOPROL-XL) 50 MG 24 hr tablet TAKE ONE TABLET EACH DAY 08/04/13   Hendricks Limes, MD  ONE TOUCH ULTRA TEST test strip CHECK BLOOD GLUCOSE (SUGAR) DAILY 02/15/14   Hendricks Limes, MD  ONETOUCH DELICA LANCETS 39J MISC CHECK BLOOD GLUCOSE DAILY E11  59 02/24/14   Hendricks Limes, MD  pravastatin (PRAVACHOL) 20 MG tablet TAKE ONE TABLET AT BEDTIME 08/04/13   Hendricks Limes, MD    Physical Exam: Vitals:   12/26/18 1606 12/26/18 1635 12/26/18 1740 12/26/18 1802  BP: 136/77 136/77 116/63   Pulse:  (!) 105  89  Resp: (!) '24 16 19 18  ' Temp:      TempSrc:      SpO2:  100%  100%  Weight:      Height:        Constitutional: NAD, calm, comfortable Vitals:   12/26/18 1606 12/26/18 1635 12/26/18 1740 12/26/18 1802  BP: 136/77 136/77 116/63   Pulse:  (!) 105  89  Resp: (!) '24 16 19 18  ' Temp:      TempSrc:      SpO2:  100%  100%  Weight:      Height:       Eyes: PERRL, lids and conjunctivae normal ENMT: Mucous membranes are dry. .  Neck: normal, supple,    Respiratory: clear to auscultation bilaterally,  Cardiovascular: Regular rate and rhythm, no JVD.  Abdomen: no tenderness, no masses palpated.  Bowel sounds positive.  Musculoskeletal: . No joint deformity upper and lower extremities. Good ROM, no contractures. Normal muscle tone.  Skin: no rashes, Neurologic: alert but appears confused.  Psychiatric: . Normal mood.    Labs on Admission: I have personally reviewed following labs and imaging studies  CBC: Recent Labs  Lab 12/26/18 1257  WBC 21.9*  NEUTROABS 19.6*  HGB 11.1*  HCT 36.8*  MCV 93.4  PLT 673   Basic Metabolic Panel: Recent Labs  Lab 12/26/18 1257  NA 141  K 4.1  CL 101  CO2 22  GLUCOSE 305*  BUN 105*  CREATININE 3.19*  CALCIUM 8.4*   GFR: Estimated Creatinine Clearance: 13.6 mL/min (A) (by C-G formula based on SCr of 3.19 mg/dL (H)). Liver Function Tests: Recent Labs  Lab 12/26/18 1257  AST 24  ALT 20  ALKPHOS 47  BILITOT 0.3  PROT 6.7  ALBUMIN 3.5   Recent Labs  Lab 12/26/18 1257  LIPASE 35   No results for input(s): AMMONIA in the last 168 hours. Coagulation Profile: Recent Labs  Lab 12/26/18 1420  INR 1.4*   Cardiac Enzymes: No results for input(s): CKTOTAL, CKMB, CKMBINDEX, TROPONINI in the last 168 hours. BNP (last 3 results) No results for input(s): PROBNP in the last 8760 hours. HbA1C: No results for input(s): HGBA1C in the last 72 hours. CBG: No results for input(s): GLUCAP in the last 168 hours. Lipid Profile: No results for input(s): CHOL, HDL, LDLCALC, TRIG, CHOLHDL, LDLDIRECT in the last 72 hours. Thyroid Function Tests: No results for input(s): TSH, T4TOTAL, FREET4, T3FREE, THYROIDAB in the last 72 hours. Anemia Panel: No results for input(s): VITAMINB12, FOLATE, FERRITIN, TIBC, IRON, RETICCTPCT in the last 72 hours. Urine analysis:    Component Value Date/Time   COLORURINE YELLOW 12/26/2018 1257   APPEARANCEUR CLEAR 12/26/2018 1257   LABSPEC 1.018 12/26/2018  1257   PHURINE 5.0 12/26/2018 1257   GLUCOSEU 150 (A) 12/26/2018 1257   HGBUR NEGATIVE 12/26/2018 1257   BILIRUBINUR NEGATIVE 12/26/2018 1257   KETONESUR 5 (A) 12/26/2018 1257   PROTEINUR NEGATIVE 12/26/2018 1257   UROBILINOGEN 1.0 11/12/2012 1848   NITRITE NEGATIVE 12/26/2018 1257   LEUKOCYTESUR NEGATIVE 12/26/2018 1257    Radiological Exams on Admission: Ct Abdomen Pelvis  Wo Contrast  Result Date: 12/26/2018 CLINICAL DATA:  Diarrhea since yesterday evening. Complains of body aches in loss of appetite EXAM: CT ABDOMEN AND PELVIS WITHOUT CONTRAST TECHNIQUE: Multidetector CT imaging of the abdomen and pelvis was performed following the standard protocol without IV contrast. COMPARISON:  None. FINDINGS: Lower chest: No acute abnormality. Hepatobiliary: No focal liver abnormality is seen. No gallstones, gallbladder wall thickening, or biliary dilatation. Pancreas: Unremarkable. No pancreatic ductal dilatation or surrounding inflammatory changes. Spleen: Normal in size without focal abnormality. Adrenals/Urinary Tract: Normal adrenal glands. Cystic structure within upper pole of the right kidney measures 1.9 cm. This is incompletely characterized without IV contrast. No hydronephrosis or mass identified bilaterally. Urinary bladder appears normal. Stomach/Bowel: Hiatal hernia. Moderate distension of the gastric lumen. No abnormal bowel wall thickening, inflammation or distension identified. The appendix is not confidently identified. No secondary signs however of acute appendicitis. A few scattered colonic diverticula noted without acute inflammation. Vascular/Lymphatic: Aortic atherosclerosis. No aneurysm. No abdominopelvic adenopathy identified. Reproductive: Prostate is unremarkable. Other: No abdominal wall hernia or abnormality. No abdominopelvic ascites. Musculoskeletal: Thoracolumbar scoliosis and multi level degenerative disc disease identified. No acute or suspicious osseous findings identified.  IMPRESSION: 1. No acute findings within the abdomen or pelvis. 2. Hiatal hernia. 3. Aortic atherosclerosis. The Aortic Atherosclerosis (ICD10-I70.0). Electronically Signed   By: Kerby Moors M.D.   On: 12/26/2018 16:34   Dg Chest Portable 1 View  Result Date: 12/26/2018 CLINICAL DATA:  Fever cough. EXAM: PORTABLE CHEST 1 VIEW COMPARISON:  11/07/2004. FINDINGS: Lung volumes are low. Cardiomediastinal contours are likely stable accounting for low lung volumes. Basilar opacity seen bilaterally. No signs of pleural effusion. Study limited by lordotic positioning with respect to assessment of lung bases. Post median sternotomy for CABG. No acute bone finding. IMPRESSION: 1. Low lung volumes with bibasilar opacity, likely atelectasis. 2. No visible pleural effusion or pneumothorax. Electronically Signed   By: Zetta Bills M.D.   On: 12/26/2018 14:29    EKG: independently reviewed. Atrial fibrillation with RVR.   Assessment/Plan Active Problems:   GI bleeding  Fever, diarrhea// sepsis: ? Gastroenteritis vs diverticulitis vs diverticulosis.  Pt has low grade fever on arrival, with leukocytosis, tachycardic, low normal BP, elevated lactic acid and AKI.  Admit to step down for closer monitoring.  Blood cultures drawn. GI pathogen pcr ordered.  Trend lactic acid.  Started the patient empirically on IV rocephin and flagyl.  Resume IV fluids NS at 169m/hr.     Atrial fibrillation with RVR: Probably brought on by sepsis. Pt denies any chest pain or sob.  Rate control with cardizem gtt. No ECHO on epic.  Resume home metoprolol 12.5 mg BID.  Order echocardiogram.  eliquis on hold due to GI bleed.     Tarry stools and rectal bleeding:  Stool for occult blood is positive. Baseline hemoglobin is around 14, currently its at 11. Suspect it might drop further after hydration.  Hold eliquis.  GI consulted for evaluation of GI bleeding and EGD in am.   pt NPO .  Started the patient on IV PPI.    Transfuse to keep hemoglobin greater than 7.  Get anemia panel.   AKI:  Suspect from dehydration , GI bleed and sepsis.  Get [place] to check for hydronephrosis.  Check urine studies for evaluation of Fena.    DKA: on admission with gap of 18, after 2.5 lit of IV NS pts AG closed. Repeat CBG is 134. Bicarb is 21 Uncontrolled DM with hyperglycemia;  Hemoglobin A1c is ordered.  Started on SSI.    Hypertension:  bp borderline.    Severity of Illness: The appropriate patient status for this patient is INPATIENT. Inpatient status is judged to be reasonable and necessary in order to provide the required intensity of service to ensure the patient's safety. The patient's presenting symptoms, physical exam findings, and initial radiographic and laboratory data in the context of their chronic comorbidities is felt to place them at high risk for further clinical deterioration. Furthermore, it is not anticipated that the patient will be medically stable for discharge from the hospital within 2 midnights of admission.  * I certify that at the point of admission it is my clinical judgment that the patient will require inpatient hospital care spanning beyond 2 midnights from the point of admission due to high intensity of service, high risk for further deterioration and high frequency of surveillance required.*    DVT prophylaxis: scd's Code Status: DNR Family Communication: none at bedside. Called daughter , but couldn't reach them.  Disposition Plan: pending further evaluation.  Consults called: GI consult/ [facility].  Admission status: inpatient/ SDU.    Hosie Poisson MD [facility]  12/26/2018, 6:11 PM

## 2018-12-26 NOTE — Progress Notes (Signed)
Notified provider of need to order fluid bolus.  ?

## 2018-12-27 ENCOUNTER — Encounter (HOSPITAL_COMMUNITY): Payer: Self-pay

## 2018-12-27 ENCOUNTER — Inpatient Hospital Stay (HOSPITAL_COMMUNITY): Payer: Medicare Other

## 2018-12-27 DIAGNOSIS — I361 Nonrheumatic tricuspid (valve) insufficiency: Secondary | ICD-10-CM

## 2018-12-27 DIAGNOSIS — Z7901 Long term (current) use of anticoagulants: Secondary | ICD-10-CM

## 2018-12-27 DIAGNOSIS — R778 Other specified abnormalities of plasma proteins: Secondary | ICD-10-CM

## 2018-12-27 DIAGNOSIS — I340 Nonrheumatic mitral (valve) insufficiency: Secondary | ICD-10-CM

## 2018-12-27 DIAGNOSIS — D62 Acute posthemorrhagic anemia: Secondary | ICD-10-CM

## 2018-12-27 DIAGNOSIS — K2971 Gastritis, unspecified, with bleeding: Secondary | ICD-10-CM

## 2018-12-27 DIAGNOSIS — R195 Other fecal abnormalities: Secondary | ICD-10-CM

## 2018-12-27 LAB — MRSA PCR SCREENING: MRSA by PCR: NEGATIVE

## 2018-12-27 LAB — CBG MONITORING, ED
Glucose-Capillary: 183 mg/dL — ABNORMAL HIGH (ref 70–99)
Glucose-Capillary: 152 mg/dL — ABNORMAL HIGH (ref 70–99)

## 2018-12-27 LAB — IRON AND TIBC
Iron: 58 ug/dL (ref 45–182)
Saturation Ratios: 23 % (ref 17.9–39.5)
TIBC: 257 ug/dL (ref 250–450)
UIBC: 199 ug/dL

## 2018-12-27 LAB — CBC
HCT: 29.5 % — ABNORMAL LOW (ref 39.0–52.0)
Hemoglobin: 9.1 g/dL — ABNORMAL LOW (ref 13.0–17.0)
MCH: 28.9 pg (ref 26.0–34.0)
MCHC: 30.8 g/dL (ref 30.0–36.0)
MCV: 93.7 fL (ref 80.0–100.0)
Platelets: 174 10*3/uL (ref 150–400)
RBC: 3.15 MIL/uL — ABNORMAL LOW (ref 4.22–5.81)
RDW: 15.1 % (ref 11.5–15.5)
WBC: 15.5 10*3/uL — ABNORMAL HIGH (ref 4.0–10.5)
nRBC: 0 % (ref 0.0–0.2)

## 2018-12-27 LAB — BASIC METABOLIC PANEL
Anion gap: 10 (ref 5–15)
BUN: 89 mg/dL — ABNORMAL HIGH (ref 8–23)
CO2: 21 mmol/L — ABNORMAL LOW (ref 22–32)
Calcium: 7.4 mg/dL — ABNORMAL LOW (ref 8.9–10.3)
Chloride: 110 mmol/L (ref 98–111)
Creatinine, Ser: 1.73 mg/dL — ABNORMAL HIGH (ref 0.61–1.24)
GFR calc Af Amer: 39 mL/min — ABNORMAL LOW (ref >60)
GFR calc non Af Amer: 34 mL/min — ABNORMAL LOW (ref >60)
Glucose, Bld: 187 mg/dL — ABNORMAL HIGH (ref 70–99)
Potassium: 3.6 mmol/L (ref 3.5–5.1)
Sodium: 141 mmol/L (ref 135–145)

## 2018-12-27 LAB — RETICULOCYTES
Immature Retic Fract: 20.9 % — ABNORMAL HIGH (ref 2.3–15.9)
RBC.: 3.15 MIL/uL — ABNORMAL LOW (ref 4.22–5.81)
Retic Count, Absolute: 60.5 10*3/uL (ref 19.0–186.0)
Retic Ct Pct: 1.9 % (ref 0.4–3.1)

## 2018-12-27 LAB — GLUCOSE, CAPILLARY: Glucose-Capillary: 139 mg/dL — ABNORMAL HIGH (ref 70–99)

## 2018-12-27 LAB — SARS CORONAVIRUS 2 (TAT 6-24 HRS): SARS Coronavirus 2: NEGATIVE

## 2018-12-27 LAB — ECHOCARDIOGRAM COMPLETE
Height: 66 in
Weight: 2560 oz

## 2018-12-27 LAB — TROPONIN I (HIGH SENSITIVITY): Troponin I (High Sensitivity): 141 ng/L (ref <18)

## 2018-12-27 LAB — FERRITIN: Ferritin: 59 ng/mL (ref 24–336)

## 2018-12-27 LAB — FOLATE: Folate: 5 ng/mL — ABNORMAL LOW (ref >5.9)

## 2018-12-27 LAB — VITAMIN B12: Vitamin B-12: 220 pg/mL (ref 180–914)

## 2018-12-27 LAB — LACTIC ACID, PLASMA: Lactic Acid, Venous: 1.5 mmol/L (ref 0.5–1.9)

## 2018-12-27 MED ORDER — METOPROLOL SUCCINATE ER 25 MG PO TB24: 25.0000 mg | ORAL_TABLET | Freq: Every day | ORAL | Status: DC

## 2018-12-27 MED ORDER — ORAL CARE MOUTH RINSE
15.0000 mL | Freq: Two times a day (BID) | OROMUCOSAL | Status: DC
  Administered 2018-12-27 – 2018-12-30 (×5): 15 mL via OROMUCOSAL

## 2018-12-27 MED ORDER — CHLORHEXIDINE GLUCONATE CLOTH 2 % EX PADS
6.0000 | MEDICATED_PAD | Freq: Every day | CUTANEOUS | Status: DC
  Administered 2018-12-27 – 2018-12-29 (×2): 6 via TOPICAL

## 2018-12-27 MED ORDER — STERILE WATER FOR INJECTION IJ SOLN
INTRAMUSCULAR | Status: AC
  Administered 2018-12-27: 15:00:00
  Filled 2018-12-27: qty 10

## 2018-12-27 MED ORDER — LEVOTHYROXINE SODIUM 25 MCG PO TABS
25.0000 ug | ORAL_TABLET | Freq: Every day | ORAL | Status: DC
  Administered 2018-12-28 – 2018-12-30 (×3): 25 ug via ORAL
  Filled 2018-12-27 (×3): qty 1

## 2018-12-27 MED FILL — Dextrose Inj 5%: INTRAVENOUS | Qty: 100 | Status: AC

## 2018-12-27 MED FILL — Diltiazem HCl IV Soln 25 MG/5ML (5 MG/ML): INTRAVENOUS | Qty: 25 | Status: AC

## 2018-12-27 NOTE — ED Notes (Signed)
Pt checked, pt has no bowel movement at this time, will monitor.

## 2018-12-27 NOTE — Consult Note (Addendum)
Referring Provider:  [facility]         Primary Care Physician:  Hendricks Limes, MD Primary Gastroenterologist: Previously Gerald. Sharlett Iles        Reason for Consultation: GI bleed                 ASSESSMENT /  PLAN    85 yo male with dementia, DM, HTN, PAF on Eliquis  1. GIB on Eliquis with dark, heme + stool. Reportedly having diarrhea but could be from GI bleed. No loose stool on DRE today. GI path panel pending  --Continue to hold Eliquis.Will need EGD, most likely to be done tomorrow. Will need to speak with family for consent. --Continue BID IV PPI   2. ABL anemia.  No recent baseline hgb available but it was 14.7 in 2014 and now drifting from 11.1 to 9.1.  --Transfuse as needed  3. AKI, improving  4. Fever. Blood cultures pending. GI path panel pending. No acute findings on non-contrast CT scan  5 AFIB with RVR, ? Secondary to sepsis. Home metoprolol, cardizem gtt, Eliquis on hold  6. Elevated troponin, ? Multifactorial ( ? Sepis, afib, AKI)  7. DKA on admission. On SSI. AG closed.     HPI:     Gerald [name] is a 85 y.o. male with PMH significant for dementia , DM, hypertension, PAF on Eliquis, diverticulosis. He had a colon polyp removed in 2009 (path unknown).  Patient admitted from SNF yesterday for evaluation of diarrhea. He was febrile on arrival ( 100.2). ED staff noted black tarry stool on buttocks.   He is pleasantly confused, history comes from chart.    Data reviewed: On admission WBC 21K, down to 15.5 today. Lactic acid 3.6 >>> 1.5 Troponin 202 >>> 141 Hemoglobin in 14,7 in 2014. On admission yesterday it was 11.1, down to 9.1 today.  Ferritin 59  **No acute findings on noncontrast CT scan.  He has a hiatal hernia, stomach moderately distented.  No bowel wall thickening.  2009 colonoscopy for hematochezia - 9 mm hepatic flexure polyp, diverticulosis and internal hemorrhoids   Past Medical History:  Diagnosis Date   Anemia    Diabetes  mellitus    Hypertension    Macular degeneration    Gerald [name]   Stricture    PMH OF    Past Surgical History:  Procedure Laterality Date   CATARACT EXTRACTION     OD   COLONOSCOPY W/ POLYPECTOMY  2009   CORONARY ARTERY BYPASS GRAFT     ESOPHAGEAL DILATION     x 1   INGUINAL HERNIA REPAIR  1998   Bilateral   LUMBAR LAMINECTOMY  1996   Macular Degeneration Surgery      Prior to Admission medications   Medication Sig Start Date End Date Taking? Authorizing Provider  aspirin EC 81 MG tablet Take 81 mg by mouth daily.   Yes [provider]  Blood Glucose Monitoring Suppl (ONE TOUCH ULTRA 2) W/DEVICE KIT Test blood sugar daily. Dx Code: 250.72 01/18/13  Yes Hendricks Limes, MD  ELIQUIS 2.5 MG TABS tablet Take 2.5 mg by mouth 2 (two) times daily. 12/21/18  Yes [provider]  Insulin Pen Needle (B-D UF III MINI PEN NEEDLES) 31G X 5 MM MISC USE TO TEST BLOOD SUGARS ICD 10 E11 59 03/13/14  Yes Hendricks Limes, MD  LANTUS 100 UNIT/ML injection Inject 10 Units into the skin daily. 12/02/18  Yes [provider]  levothyroxine (SYNTHROID) 25 MCG tablet Take 25 mcg by mouth every morning. 12/21/18  Yes [provider]  lisinopril (ZESTRIL) 5 MG tablet Take 5 mg by mouth daily. 12/14/18  Yes [provider]  metoprolol succinate (TOPROL-XL) 25 MG 24 hr tablet Take 25 mg by mouth daily. 12/14/18  Yes [provider]  ONE TOUCH ULTRA TEST test strip CHECK BLOOD GLUCOSE (SUGAR) DAILY 02/15/14  Yes Hendricks Limes, MD  [facility] DELICA LANCETS 01V MISC CHECK BLOOD GLUCOSE DAILY E11  59 02/24/14  Yes Hendricks Limes, MD  pravastatin (PRAVACHOL) 10 MG tablet Take 10 mg by mouth daily. 12/23/18  Yes [provider]  sertraline (ZOLOFT) 50 MG tablet Take 50 mg by mouth daily. 12/21/18  Yes [provider]    Current Facility-Administered Medications  Medication Dose Route Frequency Provider Last Rate Last Dose   0.9  %  sodium chloride infusion   Intravenous Continuous Hosie Poisson, MD 100 mL/hr at 12/26/18 2113     cefTRIAXone (ROCEPHIN) 2 g in sodium chloride 0.9 % 100 mL IVPB  2 g Intravenous Q24H Hosie Poisson, MD       dextrose 50 % solution 0-50 mL  0-50 mL Intravenous PRN Hosie Poisson, MD       diltiazem (CARDIZEM) 125 mg in dextrose 5% 125 mL (1 mg/mL) infusion  5-15 mg/hr Intravenous Continuous Hosie Poisson, MD 5 mL/hr at 12/26/18 1740 5 mg/hr at 12/26/18 1740   insulin aspart (novoLOG) injection 0-5 Units  0-5 Units Subcutaneous QHS Hosie Poisson, MD       insulin aspart (novoLOG) injection 0-9 Units  0-9 Units Subcutaneous TID WC Hosie Poisson, MD   2 Units at 12/27/18 0925   metoprolol tartrate (LOPRESSOR) tablet 12.5 mg  12.5 mg Oral BID Hosie Poisson, MD   Stopped at 12/27/18 1045   metroNIDAZOLE (FLAGYL) IVPB 500 mg  500 mg Intravenous Q8H Hosie Poisson, MD   Stopped at 12/27/18 0835   pantoprazole (PROTONIX) injection 40 mg  40 mg Intravenous Q12H Hosie Poisson, MD   40 mg at 12/26/18 2356   sterile water (preservative free) injection            Current Outpatient Medications  Medication Sig Dispense Refill   aspirin EC 81 MG tablet Take 81 mg by mouth daily.     Blood Glucose Monitoring Suppl (ONE TOUCH ULTRA 2) W/DEVICE KIT Test blood sugar daily. Dx Code: 250.72 1 each 0   ELIQUIS 2.5 MG TABS tablet Take 2.5 mg by mouth 2 (two) times daily.     Insulin Pen Needle (B-D UF III MINI PEN NEEDLES) 31G X 5 MM MISC USE TO TEST BLOOD SUGARS ICD 10 E11 59 100 each 3   LANTUS 100 UNIT/ML injection Inject 10 Units into the skin daily.     levothyroxine (SYNTHROID) 25 MCG tablet Take 25 mcg by mouth every morning.     lisinopril (ZESTRIL) 5 MG tablet Take 5 mg by mouth daily.     metoprolol succinate (TOPROL-XL) 25 MG 24 hr tablet Take 25 mg by mouth daily.     ONE TOUCH ULTRA TEST test strip CHECK BLOOD GLUCOSE (SUGAR) DAILY 100 each 3   ONETOUCH DELICA LANCETS 49S MISC CHECK  BLOOD GLUCOSE DAILY E11  59 100 each 3   pravastatin (PRAVACHOL) 10 MG tablet Take 10 mg by mouth daily.     sertraline (ZOLOFT) 50 MG tablet Take 50 mg by mouth daily.      Allergies as  of 12/26/2018   (No Known Allergies)    Family History  Problem Relation Age of Onset   Colon cancer Brother    Arthritis Father    Hypertension Mother    Coronary artery disease Mother    Stroke Mother 69       ?   Heart attack Mother 21       ?   Diabetes Paternal Grandmother    Diabetes Maternal Grandmother     Social History   Socioeconomic History   Marital status: Married    Spouse name: Not on file   Number of children: Not on file   Years of education: Not on file   Highest education level: Not on file  Occupational History   Not on file  Social Needs   Financial resource strain: Not on file   Food insecurity    Worry: Not on file    Inability: Not on file   Transportation needs    Medical: Not on file    Non-medical: Not on file  Tobacco Use   Smoking status: Former Smoker    Quit date: 01/21/1988    Years since quitting: 30.9   Smokeless tobacco: Never Used   Tobacco comment: smoked 1951-1990, up to 2 ppd  Substance and Sexual Activity   Alcohol use: No   Drug use: No   Sexual activity: Not on file  Lifestyle   Physical activity    Days per week: Not on file    Minutes per session: Not on file   Stress: Not on file  Relationships   Social connections    Talks on phone: Not on file    Gets together: Not on file    Attends religious service: Not on file    Active member of club or organization: Not on file    Attends meetings of clubs or organizations: Not on file    Relationship status: Not on file   Intimate partner violence    Fear of current or ex partner: Not on file    Emotionally abused: Not on file    Physically abused: Not on file    Forced sexual activity: Not on file  Other Topics Concern   Not on file  Social  History Narrative   WALKS 6x /WEEK    Review of Systems: Unable to obtain secondary to confusion.   Physical Exam: Vital signs in last 24 hours: Temp:  [98.6 F (37 C)-99.6 F (37.6 C)] 99.6 F (37.6 C) (12/06 1335) Pulse Rate:  [51-155] 94 (12/07 0919) Resp:  [13-29] 16 (12/07 1000) BP: (95-138)/(48-93) 107/64 (12/07 1000) SpO2:  [93 %-100 %] 95 % (12/07 1000) Weight:  [72.6 kg] 72.6 kg (12/06 1321)   General:   Alert, well-developed,  male in NAD Psych:  Pleasantly confused, cooperative. t. Eyes:  Pupils equal, sclera clear, no icterus.   Conjunctiva pink. Ears:  Normal auditory acuity. Nose:  No deformity, discharge,  or lesions. Neck:  Supple; no masses Lungs:  Chest is clear.  Heart:  Regular rate, no lower extremity edema Abdomen:  Soft, non-distended, nontender, BS active, no palp mass   Rectal:  Scant amount of dark stool in vault Msk:  Symmetrical without gross deformities. . Neurologic:  Alert , confused Skin:  Intact without significant lesions or rashes.   Intake/Output from previous day: 12/06 0701 - 12/07 0700 In: 2200 [IV Piggyback:2200] Out: -  Intake/Output this shift: No intake/output data recorded.  Lab Results: Recent Labs  12/26/18 1257 12/27/18 0522  WBC 21.9* 15.5*  HGB 11.1* 9.1*  HCT 36.8* 29.5*  PLT 279 174   BMET Recent Labs    12/26/18 1924 12/26/18 2139 12/27/18 0633  NA 139 139 141  K 3.7 3.9 3.6  CL 106 111 110  CO2 21* 19* 21*  GLUCOSE 256* 215* 187*  BUN 111* 96* 89*  CREATININE 2.37* 2.13* 1.73*  CALCIUM 7.2* 6.6* 7.4*   LFT Recent Labs    12/26/18 1257  PROT 6.7  ALBUMIN 3.5  AST 24  ALT 20  ALKPHOS 47  BILITOT 0.3   PT/INR Recent Labs    12/26/18 1420  LABPROT 16.7*  INR 1.4*   Hepatitis Panel No results for input(s): HEPBSAG, HCVAB, HEPAIGM, HEPBIGM in the last 72 hours.   . CBC Latest Ref Rng & Units 12/27/2018 12/26/2018 11/12/2012  WBC 4.0 - 10.5 K/uL 15.5(H) 21.9(H) 7.0  Hemoglobin 13.0  - 17.0 g/dL 9.1(L) 11.1(L) 14.7  Hematocrit 39.0 - 52.0 % 29.5(L) 36.8(L) 45.2  Platelets 150 - 400 K/uL 174 279 247    . CMP Latest Ref Rng & Units 12/27/2018 12/26/2018 12/26/2018  Glucose 70 - 99 mg/dL 187(H) 215(H) 256(H)  BUN 8 - 23 mg/dL 89(H) 96(H) 111(H)  Creatinine 0.61 - 1.24 mg/dL 1.73(H) 2.13(H) 2.37(H)  Sodium 135 - 145 mmol/L 141 139 139  Potassium 3.5 - 5.1 mmol/L 3.6 3.9 3.7  Chloride 98 - 111 mmol/L 110 111 106  CO2 22 - 32 mmol/L 21(L) 19(L) 21(L)  Calcium 8.9 - 10.3 mg/dL 7.4(L) 6.6(L) 7.2(L)  Total Protein 6.5 - 8.1 g/dL - - -  Total Bilirubin 0.3 - 1.2 mg/dL - - -  Alkaline Phos 38 - 126 U/L - - -  AST 15 - 41 U/L - - -  ALT 0 - 44 U/L - - -   Studies/Results: Ct Abdomen Pelvis Wo Contrast  Result Date: 12/26/2018 CLINICAL DATA:  Diarrhea since yesterday evening. Complains of body aches in loss of appetite EXAM: CT ABDOMEN AND PELVIS WITHOUT CONTRAST TECHNIQUE: Multidetector CT imaging of the abdomen and pelvis was performed following the standard protocol without IV contrast. COMPARISON:  None. FINDINGS: Lower chest: No acute abnormality. Hepatobiliary: No focal liver abnormality is seen. No gallstones, gallbladder wall thickening, or biliary dilatation. Pancreas: Unremarkable. No pancreatic ductal dilatation or surrounding inflammatory changes. Spleen: Normal in size without focal abnormality. Adrenals/Urinary Tract: Normal adrenal glands. Cystic structure within upper pole of the right kidney measures 1.9 cm. This is incompletely characterized without IV contrast. No hydronephrosis or mass identified bilaterally. Urinary bladder appears normal. Stomach/Bowel: Hiatal hernia. Moderate distension of the gastric lumen. No abnormal bowel wall thickening, inflammation or distension identified. The appendix is not confidently identified. No secondary signs however of acute appendicitis. A few scattered colonic diverticula noted without acute inflammation. Vascular/Lymphatic:  Aortic atherosclerosis. No aneurysm. No abdominopelvic adenopathy identified. Reproductive: Prostate is unremarkable. Other: No abdominal wall hernia or abnormality. No abdominopelvic ascites. Musculoskeletal: Thoracolumbar scoliosis and multi level degenerative disc disease identified. No acute or suspicious osseous findings identified. IMPRESSION: 1. No acute findings within the abdomen or pelvis. 2. Hiatal hernia. 3. Aortic atherosclerosis. The Aortic Atherosclerosis (ICD10-I70.0). Electronically Signed   By: Kerby Moors M.D.   On: 12/26/2018 16:34   US Renal  Result Date: 12/27/2018 CLINICAL DATA:  Acute kidney injury EXAM: RENAL / URINARY TRACT ULTRASOUND COMPLETE COMPARISON:  None. FINDINGS: Right Kidney: Renal measurements: 9.7 x 5.5 x 6.0 cm = volume: 170 mL . Echogenicity  is mildly increased. No mass or hydronephrosis visualized. Left Kidney: Renal measurements: 9.3 x 5.7 x 6.9 cm = volume: Is 189 mL. Echogenicity within normal limits. No mass or hydronephrosis visualized. Bladder: Appears normal for degree of bladder distention. Other: None. IMPRESSION: No hydronephrosis. Mildly increased echogenicity of the right kidney, possibly due to chronic medical renal disease. Electronically Signed   By: Ulyses Jarred M.D.   On: 12/27/2018 02:34   Dg Chest Portable 1 View  Result Date: 12/26/2018 CLINICAL DATA:  Fever cough. EXAM: PORTABLE CHEST 1 VIEW COMPARISON:  11/07/2004. FINDINGS: Lung volumes are low. Cardiomediastinal contours are likely stable accounting for low lung volumes. Basilar opacity seen bilaterally. No signs of pleural effusion. Study limited by lordotic positioning with respect to assessment of lung bases. Post median sternotomy for CABG. No acute bone finding. IMPRESSION: 1. Low lung volumes with bibasilar opacity, likely atelectasis. 2. No visible pleural effusion or pneumothorax. Electronically Signed   By: Zetta Bills M.D.   On: 12/26/2018 14:29    Active Problems:   GI  bleeding    Tye Savoy, NP-C @  12/27/2018, 11:01 AM    Attending physician's note   I have taken a history, examined the patient and reviewed the chart. I agree with the Advanced Practitioner's note, impression and recommendations.  85 year old male with history of diabetes, hypertension, A. fib on chronic anticoagulation with Eliquis admitted from SNF with diarrhea, low-grade fever and ?  Melena Heme-positive dark brown stool on rectal exam by Nevin Bloodgood, not melenic appearing.  A. fib with RVR and has elevated troponin possibly secondary to demand in the setting of sepsis SARS-CoV-2 negative Leukocytosis and elevated lactic acid on admission Unclear etiology for sepsis, blood cultures pending.  CT  abdomen and pelvis noncontrast with no acute pathology  Anemia secondary to occult GI blood loss in the setting of anticoagulation Continue to monitor H&H daily No evidence of active GI hemorrhage No plan for EGD today We will continue to monitor and plan for endoscopic evaluation if needed in the future Advance diet as tolerated Continue PPI twice daily Hold Eliquis  We will continue to follow, please call with any questions  K. Denzil Magnuson , MD [phone]

## 2018-12-27 NOTE — Progress Notes (Addendum)
PROGRESS NOTE    Gerald [name]  FAO:[id number] DOB: 12/09/1925 DOA: 12/26/2018 PCP: Pecola LawlessHopper, William F, MD   Brief Narrative:  Patient is a 85 year old male with history of advanced dementia, diabetes-2, hypertension, diverticulosis, proximal A. fib on Eliquis who is from skilled nursing facility who was sent to the emergency department for fever, diarrhea, melanotic stools.  Patient has advanced dementia and cannot contribute anything to the history.  He was found to have new onset anemia, black stools.  He was found to have acute kidney injury on prsentation.  CT abdomen/pelvis did not show any acute intra-abdominal findings.  Stools were guaiac positive.  He was also found in A. fib with RVR so started on Cardizem drip. GI already consulted.  Planning for EGD tomorrow.  Assessment & Plan:   Active Problems:   GI bleeding   Melanotic stools: Does not have active bleeding at present.  FOBT positive.  Presented with dark loose stools.  Baseline hemoglobin around 14 which is dropped to 9 today.  He is on Eliquis, aspirin at skilled nursing facility.  GI following.  Protonix 40 mg IV twice daily.  Fever/sepsis: Was febrile on presentation.  Elevated lactic acid.  Had diarrhea.  GI pathogen panel pending.  Started on ceftriaxone and Flagyl for possible colitis.  Continue to antibiotics for now.  Follow-up cultures.  Has leukocytosis  Acute kidney injury: Presented with creatinine of 3 .  His creatinine was 1 on 11/12/2012.  Recent kidney function unavailable.  Continue IV fluids.  Kidney function improving.  Proximal A. fib: Found to be in A. fib on RVR on presentation.  Currently heart rate is controlled.  He is on metoprolol which will be resumed.  We will discontinue Cardizem.    Elevated troponin: Denies any chest pain.  Could be asociated  with A. fib, AKI, possible sepsis.  Trend is flat.  Will not pursue any further intervention.  DM2 :  Continue sliding-scale insulin for now.  He is  on insulin at baseline.  Hypothyroidism: Continue Synthyroid  Hypertension: On lisinopril, metoprolol.  Lisinopril on hold due to AKI.         DVT prophylaxis: Eliquis Code Status: DNR Family Communication: None present at bedside Disposition Plan: Back to skilled nursing facility after full work-up   Consultants: None GI  Procedures: None  Antimicrobials:  Anti-infectives (From admission, onward)   Start     Dose/Rate Route Frequency Ordered Stop   12/27/18 1400  cefTRIAXone (ROCEPHIN) 2 g in sodium chloride 0.9 % 100 mL IVPB     2 g 200 mL/hr over 30 Minutes Intravenous Every 24 hours 12/26/18 1808     12/26/18 2200  metroNIDAZOLE (FLAGYL) IVPB 500 mg     500 mg 100 mL/hr over 60 Minutes Intravenous Every 8 hours 12/26/18 1808     12/26/18 1430  cefTRIAXone (ROCEPHIN) 2 g in sodium chloride 0.9 % 100 mL IVPB     2 g 200 mL/hr over 30 Minutes Intravenous  Once 12/26/18 1421 12/26/18 1607   12/26/18 1430  metroNIDAZOLE (FLAGYL) IVPB 500 mg     500 mg 100 mL/hr over 60 Minutes Intravenous  Once 12/26/18 1421 12/26/18 1738      Subjective:  Patient seen and examined the bedside this morning in the emergency department.  Currently he is hemodynamically stable.  Was not in any kind of distress.  Comfortable.  Demented so could not contribute any history.  Denies any complaints  Objective: Vitals:   12/27/18  1200 12/27/18 1330 12/27/18 1345 12/27/18 1400  BP: 124/70 124/86 124/86 119/65  Pulse: 77  91   Resp: (!) Temp:      TempSrc:      SpO2: 100%  100%   Weight:      Height:        Intake/Output Summary (Last 24 hours) at 12/27/2018 1412 Last data filed at 12/26/2018 1738 Gross per 24 hour  Intake 2200 ml  Output -  Net 2200 ml   Filed Weights   12/26/18 1321  Weight: 72.6 kg    Examination:  General exam: Appears calm and comfortable ,Not in distress,dementia HEENT:PERRL,Oral mucosa moist, Ear/Nose normal on gross exam Respiratory  system: Bilateral equal air entry, normal vesicular breath sounds, no wheezes or crackles  Cardiovascular system: Afib. No JVD, murmurs, rubs, gallops or clicks. No pedal edema. Gastrointestinal system: Abdomen is nondistended, soft and nontender. No organomegaly or masses felt. Normal bowel sounds heard. Central nervous system: Alert and awake but not oriented. No focal neurological deficits. Extremities: No edema, no clubbing ,no cyanosis, distal peripheral pulses palpable. Skin: No rashes, lesions or ulcers,no icterus ,no pallor Psychiatry: Judgement and insight appear impaired   Data Reviewed: I have personally reviewed following labs and imaging studies  CBC: Recent Labs  Lab 12/26/18 1257 12/27/18 0522  WBC 21.9* 15.5*  NEUTROABS 19.6*  --   HGB 11.1* 9.1*  HCT 36.8* 29.5*  MCV 93.4 93.7  PLT 279 174   Basic Metabolic Panel: Recent Labs  Lab 12/26/18 1257 12/26/18 1924 12/26/18 2139 12/27/18 0633  NA 141 139 139 141  K 4.1 3.7 3.9 3.6  CL 101 106 111 110  CO2 22 21* 19* 21*  GLUCOSE 305* 256* 215* 187*  BUN 105* 111* 96* 89*  CREATININE 3.19* 2.37* 2.13* 1.73*  CALCIUM 8.4* 7.2* 6.6* 7.4*   GFR: Estimated Creatinine Clearance: 25.1 mL/min (A) (by C-G formula based on SCr of 1.73 mg/dL (H)). Liver Function Tests: Recent Labs  Lab 12/26/18 1257  AST 24  ALT 20  ALKPHOS 47  BILITOT 0.3  PROT 6.7  ALBUMIN 3.5   Recent Labs  Lab 12/26/18 1257  LIPASE 35   No results for input(s): AMMONIA in the last 168 hours. Coagulation Profile: Recent Labs  Lab 12/26/18 1420  INR 1.4*   Cardiac Enzymes: No results for input(s): CKTOTAL, CKMB, CKMBINDEX, TROPONINI in the last 168 hours. BNP (last 3 results) No results for input(s): PROBNP in the last 8760 hours. HbA1C: Recent Labs    12/26/18 1924  HGBA1C 6.3*   CBG: Recent Labs  Lab 12/26/18 2135 12/27/18 0803 12/27/18 1201  GLUCAP 198* 183* 152*   Lipid Profile: No results for input(s): CHOL,  HDL, LDLCALC, TRIG, CHOLHDL, LDLDIRECT in the last 72 hours. Thyroid Function Tests: No results for input(s): TSH, T4TOTAL, FREET4, T3FREE, THYROIDAB in the last 72 hours. Anemia Panel: Recent Labs    12/27/18 0522  VITAMINB12 220  FOLATE 5.0*  FERRITIN 59  TIBC 257  IRON 58  RETICCTPCT 1.9   Sepsis Labs: Recent Labs  Lab 12/26/18 1307 12/26/18 1507 12/27/18 0522  LATICACIDVEN 4.8* 3.6* 1.5    Recent Results (from the past 240 hour(s))  SARS CORONAVIRUS 2 (TAT 6-24 HRS) Nasopharyngeal Nasopharyngeal Swab     Status: None   Collection Time: 12/26/18  5:20 PM   Specimen: Nasopharyngeal Swab  Result Value Ref Range Status   SARS Coronavirus 2 NEGATIVE NEGATIVE Final  Comment: (NOTE) SARS-CoV-2 target nucleic acids are NOT DETECTED. The SARS-CoV-2 RNA is generally detectable in upper and lower respiratory specimens during the acute phase of infection. Negative results do not preclude SARS-CoV-2 infection, do not rule out co-infections with other pathogens, and should not be used as the sole basis for treatment or other patient management decisions. Negative results must be combined with clinical observations, patient history, and epidemiological information. The expected result is Negative. Fact Sheet for Patients: HairSlick.no Fact Sheet for Healthcare Providers: quierodirigir.com This test is not yet approved or cleared by the [place] FDA and  has been authorized for detection and/or diagnosis of SARS-CoV-2 by FDA under an Emergency Use Authorization (EUA). This EUA will remain  in effect (meaning this test can be used) for the duration of the COVID-19 declaration under Section 56 4(b)(1) of the Act, 21 U.S.C. section 360bbb-3(b)(1), unless the authorization is terminated or revoked sooner. Performed at [facility], [address], [place], [place] [address]          Radiology Studies: Ct  Abdomen Pelvis Wo Contrast  Result Date: 12/26/2018 CLINICAL DATA:  Diarrhea since yesterday evening. Complains of body aches in loss of appetite EXAM: CT ABDOMEN AND PELVIS WITHOUT CONTRAST TECHNIQUE: Multidetector CT imaging of the abdomen and pelvis was performed following the standard protocol without IV contrast. COMPARISON:  None. FINDINGS: Lower chest: No acute abnormality. Hepatobiliary: No focal liver abnormality is seen. No gallstones, gallbladder wall thickening, or biliary dilatation. Pancreas: Unremarkable. No pancreatic ductal dilatation or surrounding inflammatory changes. Spleen: Normal in size without focal abnormality. Adrenals/Urinary Tract: Normal adrenal glands. Cystic structure within upper pole of the right kidney measures 1.9 cm. This is incompletely characterized without IV contrast. No hydronephrosis or mass identified bilaterally. Urinary bladder appears normal. Stomach/Bowel: Hiatal hernia. Moderate distension of the gastric lumen. No abnormal bowel wall thickening, inflammation or distension identified. The appendix is not confidently identified. No secondary signs however of acute appendicitis. A few scattered colonic diverticula noted without acute inflammation. Vascular/Lymphatic: Aortic atherosclerosis. No aneurysm. No abdominopelvic adenopathy identified. Reproductive: Prostate is unremarkable. Other: No abdominal wall hernia or abnormality. No abdominopelvic ascites. Musculoskeletal: Thoracolumbar scoliosis and multi level degenerative disc disease identified. No acute or suspicious osseous findings identified. IMPRESSION: 1. No acute findings within the abdomen or pelvis. 2. Hiatal hernia. 3. Aortic atherosclerosis. The Aortic Atherosclerosis (ICD10-I70.0). Electronically Signed   By: Signa Kell M.D.   On: 12/26/2018 16:34   US Renal  Result Date: 12/27/2018 CLINICAL DATA:  Acute kidney injury EXAM: RENAL / URINARY TRACT ULTRASOUND COMPLETE COMPARISON:  None. FINDINGS:  Right Kidney: Renal measurements: 9.7 x 5.5 x 6.0 cm = volume: 170 mL . Echogenicity is mildly increased. No mass or hydronephrosis visualized. Left Kidney: Renal measurements: 9.3 x 5.7 x 6.9 cm = volume: Is 189 mL. Echogenicity within normal limits. No mass or hydronephrosis visualized. Bladder: Appears normal for degree of bladder distention. Other: None. IMPRESSION: No hydronephrosis. Mildly increased echogenicity of the right kidney, possibly due to chronic medical renal disease. Electronically Signed   By: Deatra Claar M.D.   On: 12/27/2018 02:34   Dg Chest Portable 1 View  Result Date: 12/26/2018 CLINICAL DATA:  Fever cough. EXAM: PORTABLE CHEST 1 VIEW COMPARISON:  11/07/2004. FINDINGS: Lung volumes are low. Cardiomediastinal contours are likely stable accounting for low lung volumes. Basilar opacity seen bilaterally. No signs of pleural effusion. Study limited by lordotic positioning with respect to assessment of lung bases. Post median sternotomy for  CABG. No acute bone finding. IMPRESSION: 1. Low lung volumes with bibasilar opacity, likely atelectasis. 2. No visible pleural effusion or pneumothorax. Electronically Signed   By: Zetta Bills M.D.   On: 12/26/2018 14:29        Scheduled Meds: . insulin aspart  0-5 Units Subcutaneous QHS  . insulin aspart  0-9 Units Subcutaneous TID WC  . levothyroxine  25 mcg Oral q morning - 10a  . metoprolol succinate  25 mg Oral Daily  . pantoprazole (PROTONIX) IV  40 mg Intravenous Q12H  . sterile water (preservative free)       Continuous Infusions: . sodium chloride 100 mL/hr at 12/26/18 2113  . cefTRIAXone (ROCEPHIN)  IV    . metronidazole Stopped (12/27/18 0835)     LOS: 1 day    Time spent: 35 mins.More than 50% of that time was spent in counseling and/or coordination of care.      Shelly Coss, MD [facility] Pager [phone]  If 7PM-7AM, please contact night-coverage [email or web address] Password [email or web address] 12/27/2018, 2:12  PM

## 2018-12-27 NOTE — Progress Notes (Signed)
Inpatient Diabetes Program Recommendations  AACE/ADA: New Consensus Statement on Inpatient Glycemic Control (2015)  Target Ranges:  Prepandial:   less than 140 mg/dL      Peak postprandial:   less than 180 mg/dL (1-2 hours)      Critically ill patients:  140 - 180 mg/dL   Lab Results  Component Value Date   GLUCAP 152 (H) 12/27/2018   HGBA1C 6.3 (H) 12/26/2018    Review of Glycemic Control  Diabetes history: DM2 Outpatient Diabetes medications: Lantus 10 units QD Current orders for Inpatient glycemic control: Novolog 0-9 units tidwc and 0-5 units QHS  HgbA1C - 6.3% - doubtful this is accurate with low H/H CO2 - 21  AG - 10 Blood sugars thus far today - 187, 183, 152 mg/dL  Inpatient Diabetes Program Recommendations:     Add Lantus 5 units QD (1/2 home dose)  Will follow closely.  Thank you. Lorenda Peck, RD, LDN, CDE Inpatient Diabetes Coordinator [phone]

## 2018-12-27 NOTE — ED Notes (Signed)
Pt stated he was unable to have bowel movement, will monitor.

## 2018-12-27 NOTE — ED Notes (Signed)
Family at bedside. 

## 2018-12-27 NOTE — Progress Notes (Signed)
  Echocardiogram 2D Echocardiogram has been performed.  Gerald [name] 12/27/2018, 2:45 PM

## 2018-12-27 NOTE — ED Notes (Signed)
Pt soiled cleaned by RN and EMT, Pt placed in new gown, sheets, and placed on new condom cath.

## 2018-12-27 NOTE — H&P (View-Only) (Signed)
Referring Provider:  [facility]         Primary Care Physician:  Hendricks Limes, MD Primary Gastroenterologist: Previously Dr. Sharlett Iles        Reason for Consultation: GI bleed                 ASSESSMENT /  PLAN    85 yo male with dementia, DM, HTN, PAF on Eliquis  1. GIB on Eliquis with dark, heme + stool. Reportedly having diarrhea but could be from GI bleed. No loose stool on DRE today. GI path panel pending  --Continue to hold Eliquis.Will need EGD, most likely to be done tomorrow. Will need to speak with family for consent. --Continue BID IV PPI   2. ABL anemia.  No recent baseline hgb available but it was 14.7 in 2014 and now drifting from 11.1 to 9.1.  --Transfuse as needed  3. AKI, improving  4. Fever. Blood cultures pending. GI path panel pending. No acute findings on non-contrast CT scan  5 AFIB with RVR, ? Secondary to sepsis. Home metoprolol, cardizem gtt, Eliquis on hold  6. Elevated troponin, ? Multifactorial ( ? Sepis, afib, AKI)  7. DKA on admission. On SSI. AG closed.     HPI:     Gerald [name] is a 85 y.o. male with PMH significant for dementia , DM, hypertension, PAF on Eliquis, diverticulosis. He had a colon polyp removed in 2009 (path unknown).  Patient admitted from SNF yesterday for evaluation of diarrhea. He was febrile on arrival ( 100.2). ED staff noted black tarry stool on buttocks.   He is pleasantly confused, history comes from chart.    Data reviewed: On admission WBC 21K, down to 15.5 today. Lactic acid 3.6 >>> 1.5 Troponin 202 >>> 141 Hemoglobin in 14,7 in 2014. On admission yesterday it was 11.1, down to 9.1 today.  Ferritin 59  **No acute findings on noncontrast CT scan.  He has a hiatal hernia, stomach moderately distented.  No bowel wall thickening.  2009 colonoscopy for hematochezia - 9 mm hepatic flexure polyp, diverticulosis and internal hemorrhoids   Past Medical History:  Diagnosis Date   Anemia    Diabetes  mellitus    Hypertension    Macular degeneration    Dr Zigmund Daniel   Stricture    PMH OF    Past Surgical History:  Procedure Laterality Date   CATARACT EXTRACTION     OD   COLONOSCOPY W/ POLYPECTOMY  2009   CORONARY ARTERY BYPASS GRAFT     ESOPHAGEAL DILATION     x 1   INGUINAL HERNIA REPAIR  1998   Bilateral   LUMBAR LAMINECTOMY  1996   Macular Degeneration Surgery      Prior to Admission medications   Medication Sig Start Date End Date Taking? Authorizing Provider  aspirin EC 81 MG tablet Take 81 mg by mouth daily.   Yes [provider]  Blood Glucose Monitoring Suppl (ONE TOUCH ULTRA 2) W/DEVICE KIT Test blood sugar daily. Dx Code: 250.72 01/18/13  Yes Hendricks Limes, MD  ELIQUIS 2.5 MG TABS tablet Take 2.5 mg by mouth 2 (two) times daily. 12/21/18  Yes [provider]  Insulin Pen Needle (B-D UF III MINI PEN NEEDLES) 31G X 5 MM MISC USE TO TEST BLOOD SUGARS ICD 10 E11 59 03/13/14  Yes Hendricks Limes, MD  LANTUS 100 UNIT/ML injection Inject 10 Units into the skin daily. 12/02/18  Yes [provider]  levothyroxine (SYNTHROID) 25 MCG tablet Take 25 mcg by mouth every morning. 12/21/18  Yes [provider]  °lisinopril (ZESTRIL) 5 MG tablet Take 5 mg by mouth daily. 12/14/18  Yes [provider]  °metoprolol succinate (TOPROL-XL) 25 MG 24 hr tablet Take 25 mg by mouth daily. 12/14/18  Yes [provider]  °ONE TOUCH ULTRA TEST test strip CHECK BLOOD GLUCOSE (SUGAR) DAILY 02/15/14  Yes Hopper, William F, MD  °ONETOUCH DELICA LANCETS 33G MISC CHECK BLOOD GLUCOSE DAILY E11  59 02/24/14  Yes Hopper, William F, MD  °pravastatin (PRAVACHOL) 10 MG tablet Take 10 mg by mouth daily. 12/23/18  Yes [provider]  °sertraline (ZOLOFT) 50 MG tablet Take 50 mg by mouth daily. 12/21/18  Yes [provider]  ° ° °Current Facility-Administered Medications  °Medication Dose Route Frequency Provider Last Rate Last Dose  °• 0.9  %  sodium chloride infusion   Intravenous Continuous Akula, Vijaya, MD 100 mL/hr at 12/26/18 2113    °• cefTRIAXone (ROCEPHIN) 2 g in sodium chloride 0.9 % 100 mL IVPB  2 g Intravenous Q24H Akula, Vijaya, MD      °• dextrose 50 % solution 0-50 mL  0-50 mL Intravenous PRN Akula, Vijaya, MD      °• diltiazem (CARDIZEM) 125 mg in dextrose 5% 125 mL (1 mg/mL) infusion  5-15 mg/hr Intravenous Continuous Akula, Vijaya, MD 5 mL/hr at 12/26/18 1740 5 mg/hr at 12/26/18 1740  °• insulin aspart (novoLOG) injection 0-5 Units  0-5 Units Subcutaneous QHS Akula, Vijaya, MD      °• insulin aspart (novoLOG) injection 0-9 Units  0-9 Units Subcutaneous TID WC Akula, Vijaya, MD   2 Units at 12/27/18 0925  °• metoprolol tartrate (LOPRESSOR) tablet 12.5 mg  12.5 mg Oral BID Akula, Vijaya, MD   Stopped at 12/27/18 1045  °• metroNIDAZOLE (FLAGYL) IVPB 500 mg  500 mg Intravenous Q8H Akula, Vijaya, MD   Stopped at 12/27/18 0835  °• pantoprazole (PROTONIX) injection 40 mg  40 mg Intravenous Q12H Akula, Vijaya, MD   40 mg at 12/26/18 2356  °• sterile water (preservative free) injection           ° °Current Outpatient Medications  °Medication Sig Dispense Refill  °• aspirin EC 81 MG tablet Take 81 mg by mouth daily.    °• Blood Glucose Monitoring Suppl (ONE TOUCH ULTRA 2) W/DEVICE KIT Test blood sugar daily. Dx Code: 250.72 1 each 0  °• ELIQUIS 2.5 MG TABS tablet Take 2.5 mg by mouth 2 (two) times daily.    °• Insulin Pen Needle (B-D UF III MINI PEN NEEDLES) 31G X 5 MM MISC USE TO TEST BLOOD SUGARS ICD 10 E11 59 100 each 3  °• LANTUS 100 UNIT/ML injection Inject 10 Units into the skin daily.    °• levothyroxine (SYNTHROID) 25 MCG tablet Take 25 mcg by mouth every morning.    °• lisinopril (ZESTRIL) 5 MG tablet Take 5 mg by mouth daily.    °• metoprolol succinate (TOPROL-XL) 25 MG 24 hr tablet Take 25 mg by mouth daily.    °• ONE TOUCH ULTRA TEST test strip CHECK BLOOD GLUCOSE (SUGAR) DAILY 100 each 3  °• ONETOUCH DELICA LANCETS 33G MISC CHECK  BLOOD GLUCOSE DAILY E11  59 100 each 3  °• pravastatin (PRAVACHOL) 10 MG tablet Take 10 mg by mouth daily.    °• sertraline (ZOLOFT) 50 MG tablet Take 50 mg by mouth daily.    ° ° °Allergies as   of 12/26/2018  °• (No Known Allergies)  ° ° °Family History  °Problem Relation Age of Onset  °• Colon cancer Brother   °• Arthritis Father   °• Hypertension Mother   °• Coronary artery disease Mother   °• Stroke Mother 42  °     ?  °• Heart attack Mother 45  °     ?  °• Diabetes Paternal Grandmother   °• Diabetes Maternal Grandmother   ° ° °Social History  ° °Socioeconomic History  °• Marital status: Married  °  Spouse name: Not on file  °• Number of children: Not on file  °• Years of education: Not on file  °• Highest education level: Not on file  °Occupational History  °• Not on file  °Social Needs  °• Financial resource strain: Not on file  °• Food insecurity  °  Worry: Not on file  °  Inability: Not on file  °• Transportation needs  °  Medical: Not on file  °  Non-medical: Not on file  °Tobacco Use  °• Smoking status: Former Smoker  °  Quit date: 01/21/1988  °  Years since quitting: 30.9  °• Smokeless tobacco: Never Used  °• Tobacco comment: smoked 1951-1990, up to 2 ppd  °Substance and Sexual Activity  °• Alcohol use: No  °• Drug use: No  °• Sexual activity: Not on file  °Lifestyle  °• Physical activity  °  Days per week: Not on file  °  Minutes per session: Not on file  °• Stress: Not on file  °Relationships  °• Social connections  °  Talks on phone: Not on file  °  Gets together: Not on file  °  Attends religious service: Not on file  °  Active member of club or organization: Not on file  °  Attends meetings of clubs or organizations: Not on file  °  Relationship status: Not on file  °• Intimate partner violence  °  Fear of current or ex partner: Not on file  °  Emotionally abused: Not on file  °  Physically abused: Not on file  °  Forced sexual activity: Not on file  °Other Topics Concern  °• Not on file  °Social  History Narrative  ° WALKS 6x /WEEK  ° ° °Review of Systems: °Unable to obtain secondary to confusion.  ° °Physical Exam: °Vital signs in last 24 hours: °Temp:  [98.6 °F (37 °C)-99.6 °F (37.6 °C)] 99.6 °F (37.6 °C) (12/06 1335) °Pulse Rate:  [51-155] 94 (12/07 0919) °Resp:  [13-29] 16 (12/07 1000) °BP: (95-138)/(48-93) 107/64 (12/07 1000) °SpO2:  [93 %-100 %] 95 % (12/07 1000) °Weight:  [72.6 kg] 72.6 kg (12/06 1321) °  °General:   Alert, well-developed,  male in NAD °Psych:  Pleasantly confused, cooperative. t. °Eyes:  Pupils equal, sclera clear, no icterus.   Conjunctiva pink. °Ears:  Normal auditory acuity. °Nose:  No deformity, discharge,  or lesions. °Neck:  Supple; no masses °Lungs:  Chest is clear.  °Heart:  Regular rate, no lower extremity edema °Abdomen:  Soft, non-distended, nontender, BS active, no palp mass   °Rectal:  Scant amount of dark stool in vault °Msk:  Symmetrical without gross deformities. . °Neurologic:  Alert , confused °Skin:  Intact without significant lesions or rashes. ° ° °Intake/Output from previous day: °12/06 0701 - 12/07 0700 °In: 2200 [IV Piggyback:2200] °Out: -  °Intake/Output this shift: °No intake/output data recorded. ° °Lab Results: °Recent Labs  °    12/26/18 1257 12/27/18 0522  WBC 21.9* 15.5*  HGB 11.1* 9.1*  HCT 36.8* 29.5*  PLT 279 174   BMET Recent Labs    12/26/18 1924 12/26/18 2139 12/27/18 0633  NA 139 139 141  K 3.7 3.9 3.6  CL 106 111 110  CO2 21* 19* 21*  GLUCOSE 256* 215* 187*  BUN 111* 96* 89*  CREATININE 2.37* 2.13* 1.73*  CALCIUM 7.2* 6.6* 7.4*   LFT Recent Labs    12/26/18 1257  PROT 6.7  ALBUMIN 3.5  AST 24  ALT 20  ALKPHOS 47  BILITOT 0.3   PT/INR Recent Labs    12/26/18 1420  LABPROT 16.7*  INR 1.4*   Hepatitis Panel No results for input(s): HEPBSAG, HCVAB, HEPAIGM, HEPBIGM in the last 72 hours.   . CBC Latest Ref Rng & Units 12/27/2018 12/26/2018 11/12/2012  WBC 4.0 - 10.5 K/uL 15.5(H) 21.9(H) 7.0  Hemoglobin 13.0  - 17.0 g/dL 9.1(L) 11.1(L) 14.7  Hematocrit 39.0 - 52.0 % 29.5(L) 36.8(L) 45.2  Platelets 150 - 400 K/uL 174 279 247    . CMP Latest Ref Rng & Units 12/27/2018 12/26/2018 12/26/2018  Glucose 70 - 99 mg/dL 187(H) 215(H) 256(H)  BUN 8 - 23 mg/dL 89(H) 96(H) 111(H)  Creatinine 0.61 - 1.24 mg/dL 1.73(H) 2.13(H) 2.37(H)  Sodium 135 - 145 mmol/L 141 139 139  Potassium 3.5 - 5.1 mmol/L 3.6 3.9 3.7  Chloride 98 - 111 mmol/L 110 111 106  CO2 22 - 32 mmol/L 21(L) 19(L) 21(L)  Calcium 8.9 - 10.3 mg/dL 7.4(L) 6.6(L) 7.2(L)  Total Protein 6.5 - 8.1 g/dL - - -  Total Bilirubin 0.3 - 1.2 mg/dL - - -  Alkaline Phos 38 - 126 U/L - - -  AST 15 - 41 U/L - - -  ALT 0 - 44 U/L - - -   Studies/Results: Ct Abdomen Pelvis Wo Contrast  Result Date: 12/26/2018 CLINICAL DATA:  Diarrhea since yesterday evening. Complains of body aches in loss of appetite EXAM: CT ABDOMEN AND PELVIS WITHOUT CONTRAST TECHNIQUE: Multidetector CT imaging of the abdomen and pelvis was performed following the standard protocol without IV contrast. COMPARISON:  None. FINDINGS: Lower chest: No acute abnormality. Hepatobiliary: No focal liver abnormality is seen. No gallstones, gallbladder wall thickening, or biliary dilatation. Pancreas: Unremarkable. No pancreatic ductal dilatation or surrounding inflammatory changes. Spleen: Normal in size without focal abnormality. Adrenals/Urinary Tract: Normal adrenal glands. Cystic structure within upper pole of the right kidney measures 1.9 cm. This is incompletely characterized without IV contrast. No hydronephrosis or mass identified bilaterally. Urinary bladder appears normal. Stomach/Bowel: Hiatal hernia. Moderate distension of the gastric lumen. No abnormal bowel wall thickening, inflammation or distension identified. The appendix is not confidently identified. No secondary signs however of acute appendicitis. A few scattered colonic diverticula noted without acute inflammation. Vascular/Lymphatic:  Aortic atherosclerosis. No aneurysm. No abdominopelvic adenopathy identified. Reproductive: Prostate is unremarkable. Other: No abdominal wall hernia or abnormality. No abdominopelvic ascites. Musculoskeletal: Thoracolumbar scoliosis and multi level degenerative disc disease identified. No acute or suspicious osseous findings identified. IMPRESSION: 1. No acute findings within the abdomen or pelvis. 2. Hiatal hernia. 3. Aortic atherosclerosis. The Aortic Atherosclerosis (ICD10-I70.0). Electronically Signed   By: Kerby Moors M.D.   On: 12/26/2018 16:34   US Renal  Result Date: 12/27/2018 CLINICAL DATA:  Acute kidney injury EXAM: RENAL / URINARY TRACT ULTRASOUND COMPLETE COMPARISON:  None. FINDINGS: Right Kidney: Renal measurements: 9.7 x 5.5 x 6.0 cm = volume: 170 mL . Echogenicity  is mildly increased. No mass or hydronephrosis visualized. Left Kidney: Renal measurements: 9.3 x 5.7 x 6.9 cm = volume: Is 189 mL. Echogenicity within normal limits. No mass or hydronephrosis visualized. Bladder: Appears normal for degree of bladder distention. Other: None. IMPRESSION: No hydronephrosis. Mildly increased echogenicity of the right kidney, possibly due to chronic medical renal disease. Electronically Signed   By: Ulyses Jarred M.D.   On: 12/27/2018 02:34   Dg Chest Portable 1 View  Result Date: 12/26/2018 CLINICAL DATA:  Fever cough. EXAM: PORTABLE CHEST 1 VIEW COMPARISON:  11/07/2004. FINDINGS: Lung volumes are low. Cardiomediastinal contours are likely stable accounting for low lung volumes. Basilar opacity seen bilaterally. No signs of pleural effusion. Study limited by lordotic positioning with respect to assessment of lung bases. Post median sternotomy for CABG. No acute bone finding. IMPRESSION: 1. Low lung volumes with bibasilar opacity, likely atelectasis. 2. No visible pleural effusion or pneumothorax. Electronically Signed   By: Zetta Bills M.D.   On: 12/26/2018 14:29    Active Problems:   GI  bleeding    Tye Savoy, NP-C @  12/27/2018, 11:01 AM    Attending physician's note   I have taken a history, examined the patient and reviewed the chart. I agree with the Advanced Practitioner's note, impression and recommendations.  85 year old male with history of diabetes, hypertension, A. fib on chronic anticoagulation with Eliquis admitted from SNF with diarrhea, low-grade fever and ?  Melena Heme-positive dark brown stool on rectal exam by Nevin Bloodgood, not melenic appearing.  A. fib with RVR and has elevated troponin possibly secondary to demand in the setting of sepsis SARS-CoV-2 negative Leukocytosis and elevated lactic acid on admission Unclear etiology for sepsis, blood cultures pending.  CT  abdomen and pelvis noncontrast with no acute pathology  Anemia secondary to occult GI blood loss in the setting of anticoagulation Continue to monitor H&H daily No evidence of active GI hemorrhage No plan for EGD today We will continue to monitor and plan for endoscopic evaluation if needed in the future Advance diet as tolerated Continue PPI twice daily Hold Eliquis  We will continue to follow, please call with any questions  K. Denzil Magnuson , MD [phone]

## 2018-12-28 ENCOUNTER — Encounter (HOSPITAL_COMMUNITY): Payer: Self-pay | Admitting: *Deleted

## 2018-12-28 ENCOUNTER — Other Ambulatory Visit: Payer: Self-pay

## 2018-12-28 ENCOUNTER — Encounter (HOSPITAL_COMMUNITY)
Admission: EM | Disposition: A | Discharge status: Home / Self Care | Payer: Self-pay | Source: Home / Self Care | Attending: Internal Medicine

## 2018-12-28 DIAGNOSIS — T18128A Food in esophagus causing other injury, initial encounter: Secondary | ICD-10-CM

## 2018-12-28 HISTORY — PX: FOREIGN BODY REMOVAL: SHX962

## 2018-12-28 HISTORY — PX: ESOPHAGOGASTRODUODENOSCOPY (EGD) WITH PROPOFOL: SHX5813

## 2018-12-28 LAB — URINE CULTURE: Culture: <10000 — AB

## 2018-12-28 LAB — CBC WITH DIFFERENTIAL/PLATELET
Abs Immature Granulocytes: 0.05 10*3/uL (ref 0.00–0.07)
Basophils Absolute: 0 10*3/uL (ref 0.0–0.1)
Basophils Relative: 0 %
Eosinophils Absolute: 0 10*3/uL (ref 0.0–0.5)
Eosinophils Relative: 0 %
HCT: 27.6 % — ABNORMAL LOW (ref 39.0–52.0)
Hemoglobin: 8.1 g/dL — ABNORMAL LOW (ref 13.0–17.0)
Immature Granulocytes: 1 %
Lymphocytes Relative: 11 %
Lymphs Abs: 1.1 10*3/uL (ref 0.7–4.0)
MCH: 27.6 pg (ref 26.0–34.0)
MCHC: 29.3 g/dL — ABNORMAL LOW (ref 30.0–36.0)
MCV: 94.2 fL (ref 80.0–100.0)
Monocytes Absolute: 1 10*3/uL (ref 0.1–1.0)
Monocytes Relative: 10 %
Neutro Abs: 7.6 10*3/uL (ref 1.7–7.7)
Neutrophils Relative %: 78 %
Platelets: 167 10*3/uL (ref 150–400)
RBC: 2.93 MIL/uL — ABNORMAL LOW (ref 4.22–5.81)
RDW: 14.9 % (ref 11.5–15.5)
WBC: 9.8 10*3/uL (ref 4.0–10.5)
nRBC: 0 % (ref 0.0–0.2)

## 2018-12-28 LAB — GLUCOSE, CAPILLARY
Glucose-Capillary: 243 mg/dL — ABNORMAL HIGH (ref 70–99)
Glucose-Capillary: 210 mg/dL — ABNORMAL HIGH (ref 70–99)
Glucose-Capillary: 128 mg/dL — ABNORMAL HIGH (ref 70–99)
Glucose-Capillary: 181 mg/dL — ABNORMAL HIGH (ref 70–99)

## 2018-12-28 LAB — BASIC METABOLIC PANEL
Anion gap: 9 (ref 5–15)
BUN: 50 mg/dL — ABNORMAL HIGH (ref 8–23)
CO2: 21 mmol/L — ABNORMAL LOW (ref 22–32)
Calcium: 7.6 mg/dL — ABNORMAL LOW (ref 8.9–10.3)
Chloride: 115 mmol/L — ABNORMAL HIGH (ref 98–111)
Creatinine, Ser: 1.17 mg/dL (ref 0.61–1.24)
GFR calc Af Amer: >60 mL/min (ref >60)
GFR calc non Af Amer: 54 mL/min — ABNORMAL LOW (ref >60)
Glucose, Bld: 195 mg/dL — ABNORMAL HIGH (ref 70–99)
Potassium: 3.6 mmol/L (ref 3.5–5.1)
Sodium: 145 mmol/L (ref 135–145)

## 2018-12-28 SURGERY — ESOPHAGOGASTRODUODENOSCOPY (EGD) WITH PROPOFOL
Anesthesia: Moderate Sedation

## 2018-12-28 MED ORDER — LACTATED RINGERS IV SOLN
INTRAVENOUS | Status: DC
  Administered 2018-12-28: 1000 mL via INTRAVENOUS

## 2018-12-28 MED ORDER — MIDAZOLAM HCL 5 MG/5ML IJ SOLN
INTRAMUSCULAR | Status: DC | PRN
  Administered 2018-12-28 (×2): 1 mg via INTRAVENOUS

## 2018-12-28 MED ORDER — FENTANYL CITRATE (PF) 100 MCG/2ML IJ SOLN
INTRAMUSCULAR | Status: AC
  Filled 2018-12-28: qty 2

## 2018-12-28 MED ORDER — INSULIN GLARGINE 100 UNIT/ML ~~LOC~~ SOLN
5.0000 [IU] | Freq: Every day | SUBCUTANEOUS | Status: DC
  Administered 2018-12-28 – 2018-12-30 (×3): 5 [IU] via SUBCUTANEOUS
  Filled 2018-12-28 (×3): qty 0.05

## 2018-12-28 MED ORDER — FENTANYL CITRATE (PF) 100 MCG/2ML IJ SOLN
INTRAMUSCULAR | Status: DC | PRN
  Administered 2018-12-28 (×2): 12.5 ug via INTRAVENOUS

## 2018-12-28 MED ORDER — MIDAZOLAM HCL (PF) 5 MG/ML IJ SOLN
INTRAMUSCULAR | Status: AC
  Filled 2018-12-28: qty 2

## 2018-12-28 MED ORDER — BUTAMBEN-TETRACAINE-BENZOCAINE 2-2-14 % EX AERO
INHALATION_SPRAY | CUTANEOUS | Status: DC | PRN
  Administered 2018-12-28: 1 via TOPICAL

## 2018-12-28 MED ORDER — METOPROLOL SUCCINATE ER 50 MG PO TB24
50.0000 mg | ORAL_TABLET | Freq: Every day | ORAL | Status: DC
  Administered 2018-12-28 – 2018-12-30 (×3): 50 mg via ORAL
  Filled 2018-12-28 (×2): qty 1
  Filled 2018-12-28: qty 2

## 2018-12-28 MED ORDER — SODIUM CHLORIDE 0.9 % IV SOLN
510.0000 mg | Freq: Once | INTRAVENOUS | Status: AC
  Administered 2018-12-28: 510 mg via INTRAVENOUS
  Filled 2018-12-28: qty 17

## 2018-12-28 SURGICAL SUPPLY — 15 items

## 2018-12-28 NOTE — Op Note (Signed)
[facility] Patient Name: Gerald [name] Procedure Date: 12/28/2018 MRN: [id number] Attending MD: Justice Britain , MD Date of Birth: 04-23-1925 CSN: [id number] Age: 85 Admit Type: Inpatient Procedure:                Upper GI endoscopy Indications:              Acute post hemorrhagic anemia, Melena Providers:                Justice Britain, MD, Angus Seller, Cletis Athens, Technician Referring MD:             [facility] Medicines:                Fentanyl 50 micrograms IV, Midazolam 2 mg IV Complications:            No immediate complications. Estimated Blood Loss:     Estimated blood loss was minimal. Procedure:                Pre-Anesthesia Assessment:                           - Prior to the procedure, a History and Physical                            was performed, and patient medications and                            allergies were reviewed. The patient's tolerance of                            previous anesthesia was also reviewed. The risks                            and benefits of the procedure and the sedation                            options and risks were discussed with the patient.                            All questions were answered, and informed consent                            was obtained. Prior Anticoagulants: The patient has                            taken Eliquis (apixaban), last dose was 2 days                            prior to procedure. ASA Grade Assessment: III - A                            patient with severe systemic disease. After  reviewing the risks and benefits, the patient was                            deemed in satisfactory condition to undergo the                            procedure.                           After obtaining informed consent, the endoscope was                            passed under direct vision. Throughout the   procedure, the patient's blood pressure, pulse, and                            oxygen saturations were monitored continuously. The                            GIF-H190 ([id number] Olympus gastroscope was                            introduced through the mouth, and advanced to the                            duodenal bulb. The upper GI endoscopy was                            accomplished without difficulty. The patient                            tolerated the procedure. Scope In: Scope Out: Findings:      Food was found in the mid esophagus and in the distal esophagus. Removal       of food was accomplished be suction via endoscope and passage of food       bezoar into the stomach gently.      LA Grade C (one or more mucosal breaks continuous between tops of 2 or       more mucosal folds, less than 75% circumference) esophagitis with mild       oozing was found in the middle and distal esophagus in region of the       previous foodstuffs.      A 3 cm hiatal hernia was found. The proximal extent of the gastric folds       (end of tubular esophagus) was 35 cm from the incisors. The hiatal       narrowing was 38 cm from the incisors. The Z-line was 35 cm from the       incisors.      A few localized Cameron's erosions were noted with no bleeding and no       stigmata of recent bleeding were found in the cardia region within       region of hiatal hernia.      A deformity was found in the gastric antrum but no evidence of ulcer       disease noted..      No other gross lesions were noted  in the entire examined stomach.      A mild deformity was found in the duodenal bulb apex that looked as if       it could be stenotic/narrowed, but the scope was able to traverse this       region .      A large non-bleeding diverticulum was found in the D1/D2 sweep after       passing the deformity in the bulb apex.      No gross lesions were noted in the second portion of the duodenum. Impression:                - Foodstuffs were noted in the middle esophagus and                            in the distal esophagus. Removal was successful by                            suction via endoscope and maneuvering food bezoar                            into stomach gently. This revealed LA Grade C                            esophagitis with bleeding/oozing noted.                           - 3 cm hiatal hernia.                           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                            Acquired deformity in the gastric antrum but no                            evidence of PUD. No other gross lesions in the                            stomach.                           - Duodenal deformity at apex of bulb. Non-bleeding                            duodenal diverticulum in D1/D2 sweep. No gross                            lesions in the second portion of the duodenum. Moderate Sedation:      Moderate (  conscious) sedation was administered by the endoscopy nurse       and supervised by the endoscopist. The following parameters were       monitored: oxygen saturation, heart rate, blood pressure, and response       to care. Total physician intraservice time was 20 minutes. Recommendation:           - The patient will be observed post-procedure,                            until all discharge criteria are met.                           - Return patient to hospital ward for ongoing care.                           - Trend Hgb/Hct.                           - IV Iron x 1 dose tomorrow.                           - H. pylori serology and if positive treat with                            quadruple therapy.                           - IV PPI BID can be transitioned to PO PPI BID                            tomorrow if he tolerates oral intake and  maintain                            until follow EGD.                           - Restart anticoagulation in 48 hours to allow                            healing of esophagitis and Cameron's erosions.                           - Patient currently has dark stools in his rectal                            vault, so I expect for him to have dark bowel                            movements so I would not be worried about this                            unless he manifests transfusion dependent anemia.  If that is the case, will consider Colonoscopy as                            Inpatient.                           - Repeat EGD in 2-3 months to ensure healing.                           - The findings and recommendations were discussed                            with the patient.                           - The findings and recommendations were discussed                            with the patient's family.                           - The findings and recommendations were discussed                            with the referring physician. Procedure Code(s):        --- Professional ---                           [phone], Esophagogastroduodenoscopy, flexible,                            transoral; with removal of foreign body(s)                           G0500, Moderate sedation services provided by the                            same physician or other qualified health care                            professional performing a gastrointestinal                            endoscopic service that sedation supports,                            requiring the presence of an independent trained                            observer to assist in the monitoring of the                            patient's level of consciousness and physiological                            status; initial 15 minutes  of intra-service time;                            patient age 68 years or older (additional  time may                            be reported with [phone], as appropriate) Diagnosis Code(s):        --- Professional ---                           [phone], Food in esophagus causing other injury,                            initial encounter                           K20.91, Esophagitis, unspecified with bleeding                           K44.9, Diaphragmatic hernia without obstruction or                            gangrene                           K31.89, Other diseases of stomach and duodenum                           D62, Acute posthemorrhagic anemia                           K92.1, Melena (includes Hematochezia)                           K57.10, Diverticulosis of small intestine without                            perforation or abscess without bleeding CPT copyright 2019 [employer]. All rights reserved. The codes documented in this report are preliminary and upon coder review may  be revised to meet current compliance requirements. Justice Britain, MD 12/28/2018 5:12:55 PM Number of Addenda: 0

## 2018-12-28 NOTE — Interval H&P Note (Signed)
History and Physical Interval Note:  12/28/2018 3:22 PM  Gerald [name]  has presented today for surgery, with the diagnosis of gastrointestinal bleeding.  The various methods of treatment have been discussed with the patient and family. After consideration of risks, benefits and other options for treatment, the patient has consented to  Procedure(s): ESOPHAGOGASTRODUODENOSCOPY (EGD) WITH PROPOFOL (N/A) as a surgical intervention.  The patient's history has been reviewed, patient examined, no change in status, stable for surgery.  I have reviewed the patient's chart and labs.  Questions were answered to the patient's satisfaction.     [employer]

## 2018-12-28 NOTE — Progress Notes (Signed)
PROGRESS NOTE    G[name]  UJW:[id number] DOB: [birth year] DOA: 12/26/2018 PCP: Pecola Lawless, MD   Brief Narrative:  Patient is a 85 year old male with history of advanced dementia, diabetes-2, hypertension, diverticulosis, proximal A. fib on Eliquis who is from skilled nursing facility who was sent to the emergency department for fever, diarrhea, melanotic stools.  Patient has history of advanced dementia .  He was found to have new onset anemia, black stools.  Found to have acute kidney injury on prsentation.  CT abdomen/pelvis did not show any acute intra-abdominal findings.  Stools were guaiac positive.  He was also found in A. fib with RVR so started on Cardizem drip. GI already consulted.  Planning for EGD .  Assessment & Plan:   Active Problems:   GI bleeding   Melanotic stools: Does not have active bleeding at present.  FOBT positive.  Presented with dark loose stools.  Baseline hemoglobin around 14 which is dropped to 8 today.  He is on Eliquis, aspirin at skilled nursing facility.  GI following.  Protonix 40 mg IV twice daily.  Eliquis, aspirin on hold.  Acute blood loss anemia/ normocytic anemia: Likely secondary to GI bleed.  Hemoglobin this morning 8.1.  Continue to monitor.  Fever/sepsis: Was febrile on presentation.  Elevated lactic acid.  Had diarrhea.  GI pathogen panel pending. Now bowel movement at present. Started on ceftriaxone and Flagyl for possible colitis but CT did not show any evidence of inflammation.  We will discontinue antibiotics for now.  Follow-up cultures.  Leucocytosis resolved   Acute kidney injury: Presented with creatinine of 3 .  His creatinine was 1 on 11/12/2012.  Kidney function improved with IV fluids and back to baseline  Proximal A. fib: Found to be in A. fib on RVR on presentation.    He is on metoprolol which will be resumed at increased dose.    Elevated troponin: Denies any chest pain.  Could be asociated  with A. fib, AKI,  possible sepsis.  Trend is flat.  Will not pursue any further intervention.  DM2 :  Continue sliding-scale insulin for now.  He is on insulin at baseline.  Hypothyroidism: Continue Synthyroid  Hypertension: On lisinopril, metoprolol.  Lisinopril on hold due to AKI.         DVT prophylaxis: SCD Code Status: DNR Family Communication: Discussed with granddaughter on 12/27/2018 Disposition Plan: Back to skilled nursing facility after full work-up   Consultants: None GI  Procedures: None  Antimicrobials:  Anti-infectives (From admission, onward)   Start     Dose/Rate Route Frequency Ordered Stop   12/27/18 1400  [MAR Hold]  cefTRIAXone (ROCEPHIN) 2 g in sodium chloride 0.9 % 100 mL IVPB     (MAR Hold since Tue 12/28/2018 at 1317.Hold Reason: Transfer to a Procedural area.)   2 g 200 mL/hr over 30 Minutes Intravenous Every 24 hours 12/26/18 1808     12/26/18 2200  [MAR Hold]  metroNIDAZOLE (FLAGYL) IVPB 500 mg     (MAR Hold since Tue 12/28/2018 at 1317.Hold Reason: Transfer to a Procedural area.)   500 mg 100 mL/hr over 60 Minutes Intravenous Every 8 hours 12/26/18 1808     12/26/18 1430  cefTRIAXone (ROCEPHIN) 2 g in sodium chloride 0.9 % 100 mL IVPB     2 g 200 mL/hr over 30 Minutes Intravenous  Once 12/26/18 1421 12/26/18 1607   12/26/18 1430  metroNIDAZOLE (FLAGYL) IVPB 500 mg     500 mg 100  mL/hr over 60 Minutes Intravenous  Once 12/26/18 1421 12/26/18 1738      Subjective:  Patient seen and examined at bedside this morning.  Appears very comfortable.  Hemodynamically stable.  Today he is alert and orientated and communicates well.  Heart rate in the range of 100 -110.  Objective: Vitals:   12/28/18 0700 12/28/18 0800 12/28/18 0900 12/28/18 1200  BP: (!) 112/57  113/61   Pulse: 95 87 (!) 117   Resp: Temp:  98.3 F (36.8 C)  98.3 F (36.8 C)  TempSrc:  Oral  Oral  SpO2: 96% 99% 93%   Weight:      Height:        Intake/Output Summary (Last 24 hours) at  12/28/2018 1320 Last data filed at 12/28/2018 1610 Gross per 24 hour  Intake 3836.74 ml  Output 1775 ml  Net 2061.74 ml   Filed Weights   12/26/18 1321  Weight: 72.6 kg    Examination:  General exam: Appears calm and comfortable ,Not in distress,dementia HEENT:PERRL,Oral mucosa moist, Ear/Nose normal on gross exam Respiratory system: Bilateral equal air entry, normal vesicular breath sounds, no wheezes or crackles  Cardiovascular system: Afib. No JVD, murmurs, rubs, gallops or clicks. No pedal edema. Gastrointestinal system: Abdomen is nondistended, soft and nontender. No organomegaly or masses felt. Normal bowel sounds heard. Central nervous system: Alert and  oriented. No focal neurological deficits. Extremities: No edema, no clubbing ,no cyanosis, distal peripheral pulses palpable. Skin: No rashes, lesions or ulcers,no icterus ,no pallor  Data Reviewed: I have personally reviewed following labs and imaging studies  CBC: Recent Labs  Lab 12/26/18 1257 12/27/18 0522 12/28/18 0221  WBC 21.9* 15.5* 9.8  NEUTROABS 19.6*  --  7.6  HGB 11.1* 9.1* 8.1*  HCT 36.8* 29.5* 27.6*  MCV 93.4 93.7 94.2  PLT 279 174 167   Basic Metabolic Panel: Recent Labs  Lab 12/26/18 1257 12/26/18 1924 12/26/18 2139 12/27/18 0633 12/28/18 0221  NA 141 139 139 141 145  K 4.1 3.7 3.9 3.6 3.6  CL 101 106 111 110 115*  CO2 22 21* 19* 21* 21*  GLUCOSE 305* 256* 215* 187* 195*  BUN 105* 111* 96* 89* 50*  CREATININE 3.19* 2.37* 2.13* 1.73* 1.17  CALCIUM 8.4* 7.2* 6.6* 7.4* 7.6*   GFR: Estimated Creatinine Clearance: 37.1 mL/min (by C-G formula based on SCr of 1.17 mg/dL). Liver Function Tests: Recent Labs  Lab 12/26/18 1257  AST 24  ALT 20  ALKPHOS 47  BILITOT 0.3  PROT 6.7  ALBUMIN 3.5   Recent Labs  Lab 12/26/18 1257  LIPASE 35   No results for input(s): AMMONIA in the last 168 hours. Coagulation Profile: Recent Labs  Lab 12/26/18 1420  INR 1.4*   Cardiac Enzymes: No  results for input(s): CKTOTAL, CKMB, CKMBINDEX, TROPONINI in the last 168 hours. BNP (last 3 results) No results for input(s): PROBNP in the last 8760 hours. HbA1C: Recent Labs    12/26/18 1924  HGBA1C 6.3*   CBG: Recent Labs  Lab 12/26/18 2135 12/27/18 0803 12/27/18 1201 12/27/18 2127  GLUCAP 198* 183* 152* 139*   Lipid Profile: No results for input(s): CHOL, HDL, LDLCALC, TRIG, CHOLHDL, LDLDIRECT in the last 72 hours. Thyroid Function Tests: No results for input(s): TSH, T4TOTAL, FREET4, T3FREE, THYROIDAB in the last 72 hours. Anemia Panel: Recent Labs    12/27/18 0522  VITAMINB12 220  FOLATE 5.0*  FERRITIN 59  TIBC 257  IRON 58  RETICCTPCT  1.9   Sepsis Labs: Recent Labs  Lab 12/26/18 1307 12/26/18 1507 12/27/18 0522  LATICACIDVEN 4.8* 3.6* 1.5    Recent Results (from the past 240 hour(s))  Blood Culture (routine x 2)     Status: None (Preliminary result)   Collection Time: 12/26/18  2:20 PM   Specimen: Right Antecubital; Blood  Result Value Ref Range Status   Specimen Description   Final    RIGHT ANTECUBITAL Performed at [facility], [address], [place], [place] [address]    Special Requests   Final    BOTTLES DRAWN AEROBIC AND ANAEROBIC Blood Culture adequate volume Performed at [facility], [address] [place], [place] [address]    Culture   Final    NO GROWTH 2 DAYS Performed at [facility], [address] [place], [place] [address]    Report Status PENDING  Incomplete  Urine culture     Status: Abnormal   Collection Time: 12/26/18  2:20 PM   Specimen: In/Out Cath Urine  Result Value Ref Range Status   Specimen Description   Final    IN/OUT CATH URINE Performed at [facility], [address], [address] [place], [place] [address]    Special Requests   Final    NONE Performed at [facility], [address], [place], [place] [address]    Culture (A)  Final      <10,000 COLONIES/mL INSIGNIFICANT GROWTH Performed at [facility], [address], [place], [place] [address]    Report Status 12/28/2018 FINAL  Final  SARS CORONAVIRUS 2 (TAT 6-24 HRS) Nasopharyngeal Nasopharyngeal Swab     Status: None   Collection Time: 12/26/18  5:20 PM   Specimen: Nasopharyngeal Swab  Result Value Ref Range Status   SARS Coronavirus 2 NEGATIVE NEGATIVE Final    Comment: (NOTE) SARS-CoV-2 target nucleic acids are NOT DETECTED. The SARS-CoV-2 RNA is generally detectable in upper and lower respiratory specimens during the acute phase of infection. Negative results do not preclude SARS-CoV-2 infection, do not rule out co-infections with other pathogens, and should not be used as the sole basis for treatment or other patient management decisions. Negative results must be combined with clinical observations, patient history, and epidemiological information. The expected result is Negative. Fact Sheet for Patients: HairSlick.no Fact Sheet for Healthcare Providers: quierodirigir.com This test is not yet approved or cleared by the [place] FDA and  has been authorized for detection and/or diagnosis of SARS-CoV-2 by FDA under an Emergency Use Authorization (EUA). This EUA will remain  in effect (meaning this test can be used) for the duration of the COVID-19 declaration under Section 56 4(b)(1) of the Act, 21 U.S.C. section 360bbb-3(b)(1), unless the authorization is terminated or revoked sooner. Performed at [facility], [address], [place], [place] [address]   MRSA PCR Screening     Status: None   Collection Time: 12/27/18  5:11 PM   Specimen: Nasal Mucosa; Nasopharyngeal  Result Value Ref Range Status   MRSA by PCR NEGATIVE NEGATIVE Final    Comment:        The GeneXpert MRSA Assay (FDA approved for NASAL specimens only), is one component of a comprehensive MRSA  colonization surveillance program. It is not intended to diagnose MRSA infection nor to guide or monitor treatment for MRSA infections. Performed at [facility], [address] [place], [place] [address]  Radiology Studies: Ct Abdomen Pelvis Wo Contrast  Result Date: 12/26/2018 CLINICAL DATA:  Diarrhea since yesterday evening. Complains of body aches in loss of appetite EXAM: CT ABDOMEN AND PELVIS WITHOUT CONTRAST TECHNIQUE: Multidetector CT imaging of the abdomen and pelvis was performed following the standard protocol without IV contrast. COMPARISON:  None. FINDINGS: Lower chest: No acute abnormality. Hepatobiliary: No focal liver abnormality is seen. No gallstones, gallbladder wall thickening, or biliary dilatation. Pancreas: Unremarkable. No pancreatic ductal dilatation or surrounding inflammatory changes. Spleen: Normal in size without focal abnormality. Adrenals/Urinary Tract: Normal adrenal glands. Cystic structure within upper pole of the right kidney measures 1.9 cm. This is incompletely characterized without IV contrast. No hydronephrosis or mass identified bilaterally. Urinary bladder appears normal. Stomach/Bowel: Hiatal hernia. Moderate distension of the gastric lumen. No abnormal bowel wall thickening, inflammation or distension identified. The appendix is not confidently identified. No secondary signs however of acute appendicitis. A few scattered colonic diverticula noted without acute inflammation. Vascular/Lymphatic: Aortic atherosclerosis. No aneurysm. No abdominopelvic adenopathy identified. Reproductive: Prostate is unremarkable. Other: No abdominal wall hernia or abnormality. No abdominopelvic ascites. Musculoskeletal: Thoracolumbar scoliosis and multi level degenerative disc disease identified. No acute or suspicious osseous findings identified. IMPRESSION: 1. No acute findings within the abdomen or pelvis. 2. Hiatal hernia. 3. Aortic  atherosclerosis. The Aortic Atherosclerosis (ICD10-I70.0). Electronically Signed   By: Kerby Moors M.D.   On: 12/26/2018 16:34   US Renal  Result Date: 12/27/2018 CLINICAL DATA:  Acute kidney injury EXAM: RENAL / URINARY TRACT ULTRASOUND COMPLETE COMPARISON:  None. FINDINGS: Right Kidney: Renal measurements: 9.7 x 5.5 x 6.0 cm = volume: 170 mL . Echogenicity is mildly increased. No mass or hydronephrosis visualized. Left Kidney: Renal measurements: 9.3 x 5.7 x 6.9 cm = volume: Is 189 mL. Echogenicity within normal limits. No mass or hydronephrosis visualized. Bladder: Appears normal for degree of bladder distention. Other: None. IMPRESSION: No hydronephrosis. Mildly increased echogenicity of the right kidney, possibly due to chronic medical renal disease. Electronically Signed   By: Ulyses Jarred M.D.   On: 12/27/2018 02:34   Dg Chest Portable 1 View  Result Date: 12/26/2018 CLINICAL DATA:  Fever cough. EXAM: PORTABLE CHEST 1 VIEW COMPARISON:  11/07/2004. FINDINGS: Lung volumes are low. Cardiomediastinal contours are likely stable accounting for low lung volumes. Basilar opacity seen bilaterally. No signs of pleural effusion. Study limited by lordotic positioning with respect to assessment of lung bases. Post median sternotomy for CABG. No acute bone finding. IMPRESSION: 1. Low lung volumes with bibasilar opacity, likely atelectasis. 2. No visible pleural effusion or pneumothorax. Electronically Signed   By: Zetta Bills M.D.   On: 12/26/2018 14:29        Scheduled Meds:  [ZOX Hold] Chlorhexidine Gluconate Cloth  6 each Topical Daily   [MAR Hold] insulin aspart  0-5 Units Subcutaneous QHS   [MAR Hold] insulin aspart  0-9 Units Subcutaneous TID WC   [MAR Hold] insulin glargine  5 Units Subcutaneous Daily   [MAR Hold] levothyroxine  25 mcg Oral Q0600   [MAR Hold] mouth rinse  15 mL Mouth Rinse BID   [MAR Hold] metoprolol succinate  50 mg Oral Daily   [MAR Hold] pantoprazole  (PROTONIX) IV  40 mg Intravenous Q12H   Continuous Infusions:  [MAR Hold] cefTRIAXone (ROCEPHIN)  IV Stopped (12/27/18 1557)   [MAR Hold] metronidazole Stopped (12/28/18 0633)     LOS: 2 days    Time spent: 35 mins.More than 50% of that time was spent in counseling  and/or coordination of care.      Burnadette PopAmrit Quindarrius Joplin, MD [facility] Pager [phone]  If 7PM-7AM, please contact night-coverage [email or web address] Password [email or web address] 12/28/2018, 1:20 PM

## 2018-12-29 ENCOUNTER — Telehealth: Payer: Self-pay

## 2018-12-29 DIAGNOSIS — K259 Gastric ulcer, unspecified as acute or chronic, without hemorrhage or perforation: Secondary | ICD-10-CM

## 2018-12-29 DIAGNOSIS — K209 Esophagitis, unspecified without bleeding: Secondary | ICD-10-CM

## 2018-12-29 LAB — CBC WITH DIFFERENTIAL/PLATELET
Abs Immature Granulocytes: 0.04 10*3/uL (ref 0.00–0.07)
Basophils Absolute: 0 10*3/uL (ref 0.0–0.1)
Basophils Relative: 0 %
Eosinophils Absolute: 0.1 10*3/uL (ref 0.0–0.5)
Eosinophils Relative: 1 %
HCT: 27 % — ABNORMAL LOW (ref 39.0–52.0)
Hemoglobin: 8.1 g/dL — ABNORMAL LOW (ref 13.0–17.0)
Immature Granulocytes: 1 %
Lymphocytes Relative: 13 %
Lymphs Abs: 1 10*3/uL (ref 0.7–4.0)
MCH: 28 pg (ref 26.0–34.0)
MCHC: 30 g/dL (ref 30.0–36.0)
MCV: 93.4 fL (ref 80.0–100.0)
Monocytes Absolute: 0.8 10*3/uL (ref 0.1–1.0)
Monocytes Relative: 10 %
Neutro Abs: 5.7 10*3/uL (ref 1.7–7.7)
Neutrophils Relative %: 75 %
Platelets: 169 10*3/uL (ref 150–400)
RBC: 2.89 MIL/uL — ABNORMAL LOW (ref 4.22–5.81)
RDW: 15.3 % (ref 11.5–15.5)
WBC: 7.6 10*3/uL (ref 4.0–10.5)
nRBC: 0 % (ref 0.0–0.2)

## 2018-12-29 LAB — GLUCOSE, CAPILLARY
Glucose-Capillary: 191 mg/dL — ABNORMAL HIGH (ref 70–99)
Glucose-Capillary: 156 mg/dL — ABNORMAL HIGH (ref 70–99)
Glucose-Capillary: 180 mg/dL — ABNORMAL HIGH (ref 70–99)
Glucose-Capillary: 115 mg/dL — ABNORMAL HIGH (ref 70–99)

## 2018-12-29 LAB — H. PYLORI ANTIBODY, IGG: H Pylori IgG: 0.71 Index Value (ref 0.00–0.79)

## 2018-12-29 MED ORDER — PANTOPRAZOLE SODIUM 40 MG PO TBEC
40.0000 mg | DELAYED_RELEASE_TABLET | Freq: Two times a day (BID) | ORAL | Status: DC
  Administered 2018-12-29 – 2018-12-30 (×3): 40 mg via ORAL
  Filled 2018-12-29 (×3): qty 1

## 2018-12-29 MED ORDER — DILTIAZEM HCL ER COATED BEADS 120 MG PO CP24
120.0000 mg | ORAL_CAPSULE | Freq: Every day | ORAL | Status: DC
  Administered 2018-12-29 – 2018-12-30 (×2): 120 mg via ORAL
  Filled 2018-12-29 (×2): qty 1

## 2018-12-29 NOTE — Evaluation (Signed)
Physical Therapy Evaluation Patient Details Name: Gerald [name] MRN: [id number] DOB: 03-Dec-1925 Today's Date: 12/29/2018   History of Present Illness  85 year old male with history of advanced dementia, diabetes-2, hypertension, diverticulosis, proximal A. fib on Eliquis who is from [name] who was sent to the emergency department for fever, diarrhea, melanotic stools.  Pt admitted for new onset anemia, black stools, acute kidney injury  Clinical Impression  Pt admitted with above diagnosis.  Pt currently with functional limitations due to the deficits listed below (see PT Problem List). Pt will benefit from skilled PT to increase their independence and safety with mobility to allow discharge to the venue listed below.  Pt agreeable to mobilize and ambulated short distance in hallway with RW.  Pt very HOH and unable to provide PLOF however admitted from [name].  Pt likely near mobility baseline.       Follow Up Recommendations No PT follow up    Equipment Recommendations  None recommended by PT    Recommendations for Other Services       Precautions / Restrictions Precautions Precautions: Fall Precaution Comments: hard of hearing      Mobility  Bed Mobility Overal bed mobility: Needs Assistance Bed Mobility: Supine to Sit     Supine to sit: Min guard;HOB elevated     General bed mobility comments: provided a hand for pt to self assist trunk upright  Transfers Overall transfer level: Needs assistance Equipment used: Rolling walker (2 wheeled) Transfers: Sit to/from [facility] Sit to Stand: Min guard Stand pivot transfers: Min guard       General transfer comment: min/guard for safety; pt with dark stool on bed pad so assisted with hygiene (also notified nurse tech)  Ambulation/Gait Ambulation/Gait assistance: Min guard Gait Distance (Feet): 100 Feet Assistive device: Rolling walker (2 wheeled) Gait Pattern/deviations:  Step-through pattern;Decreased stride length;Trunk flexed     General Gait Details: verbal cues for RW positioning, distance to tolerance  Stairs            Wheelchair Mobility    Modified Rankin (Stroke Patients Only)       Balance Overall balance assessment: Needs assistance         Standing balance support: Bilateral upper extremity supported Standing balance-Leahy Scale: Poor Standing balance comment: requires UE support                             Pertinent Vitals/Pain Pain Assessment: No/denies pain    Home Living Family/patient expects to be discharged to:: Assisted living               Home Equipment: Walker - 2 wheels      Prior Function Level of Independence: Needs assistance   Gait / Transfers Assistance Needed: pt reports using RW, unable to provide further information (?due to cognition or hearing impairment)           Hand Dominance        Extremity/Trunk Assessment   Upper Extremity Assessment Upper Extremity Assessment: Overall WFL for tasks assessed    Lower Extremity Assessment Lower Extremity Assessment: Overall WFL for tasks assessed       Communication   Communication: HOH  Cognition Arousal/Alertness: Awake/alert Behavior During Therapy: WFL for tasks assessed/performed Overall Cognitive Status: History of cognitive impairments - at baseline  General Comments: pleasant and cooperative, able to follow commands      General Comments      Exercises     Assessment/Plan    PT Assessment Patient needs continued PT services  PT Problem List Decreased strength;Decreased mobility;Decreased activity tolerance;Decreased balance       PT Treatment Interventions DME instruction;Therapeutic activities;Gait training;Therapeutic exercise;Functional mobility training;Patient/family education    PT Goals (Current goals can be found in the Care Plan section)   Acute Rehab PT Goals PT Goal Formulation: With patient Time For Goal Achievement: 01/12/19 Potential to Achieve Goals: Good    Frequency Min 2X/week   Barriers to discharge        Co-evaluation               AM-PAC PT "6 Clicks" Mobility  Outcome Measure Help needed turning from your back to your side while in a flat bed without using bedrails?: A Little Help needed moving from lying on your back to sitting on the side of a flat bed without using bedrails?: A Little Help needed moving to and from a bed to a chair (including a wheelchair)?: A Little Help needed standing up from a chair using your arms ([name].g., wheelchair or bedside chair)?: A Little Help needed to walk in hospital room?: A Little Help needed climbing 3-5 steps with a railing? : A Lot 6 Click Score: 17    End of Session Equipment Utilized During Treatment: Gait belt Activity Tolerance: Patient tolerated treatment well Patient left: in chair;with call bell/phone within reach;with chair alarm set Nurse Communication: Mobility status PT Visit Diagnosis: Difficulty in walking, not elsewhere classified (R26.2)    Time: 8563-1497 PT Time Calculation (min) (ACUTE ONLY): 19 min   Charges:   PT Evaluation $PT Eval Low Complexity: 1 Low     Gerald [name], PT, DPT Acute Rehabilitation Services    [name],Gerald [name] 12/29/2018, 2:10 PM

## 2018-12-29 NOTE — Evaluation (Signed)
SLP Cancellation Note  Patient Details Name: Gerald [name] MRN: [id number] DOB: 03/23/1925   Cancelled treatment:       Reason Eval/Treat Not Completed: Other (comment)(pt was receiving care from NT when SLP attempted visit at 1500, RN reports tolerating po intake, will continue efforts)   Macario Golds 12/29/2018, 3:42 PM   Luanna Salk, [place] [facility] SLP [place] Pager [phone] Office [phone]

## 2018-12-29 NOTE — Care Management Important Message (Signed)
Important Message  Patient Details IM Letter given to Dessa Phi RN Case Manager to present to the Patient Name: Gerald [name] MRN: [id number] Date of Birth: 09/19/1925   Medicare Important Message Given:  Yes     Kerin Salen 12/29/2018, 10:51 AM

## 2018-12-29 NOTE — Telephone Encounter (Signed)
-----   Message from Irving Copas., MD sent at 12/29/2018  3:30 PM EST ----- Regarding: Follow-up Teriana Danker,Please place a recall for an EGD in the [place] in 3 months.Nevin Bloodgood is working on a follow-up in clinic for the patient.Patient is at a facility.He is on anticoagulation that will need to be held prior to his procedure.Follow-up will work on helping set this up but I just want to make sure that there is a recall in the system.Thanks.GM

## 2018-12-29 NOTE — Progress Notes (Signed)
     Progress Note    ASSESSMENT AND PLAN:   3. 85 yo male with GI bleed on Eliquis. LA Grade C esophagitis with mild oozing found in mid and distal esophagus in area where there was also food bezoar. Other findings included a 3 cm HH with Cameron's lesions and a large non-bleeding diverticulum in D1/ D2 .  -H.pylori ab in progress - IV Iron x 1 ordered. - PO PPI BID until follow up EGD in 2-3 months.  -Restart anticoagulation in 48 hours to allow healing of esophagitis and Cameron's erosions. -Repeat EGD in 2-3 months to ensure healing. Will see in office first. Schedule is not out so our office will call to arrange a follow up appt for ~ 6 weeks.   2. DKA, resolved  3. AFIB with RVR. Rate now controlled. On Metoprolol  4. Fever / sepsis. Febrile on admission. Had diarrhea but stools may have just been loose from bleeding.  GI path panel pending. WBC normalized from 22K to 7.6.   5. Elevated troponin, multifactorial most likely ( sepsis, AKI, AFib with RVR)  6. Acute blood loss anemia. Hgb stable at 8.1. Got iv iron today  7. AKI, resolved  SUBJECTIVE    Feels okay. Up in bedside chair.  Smear of dark stool in bed   OBJECTIVE:     Vital signs in last 24 hours: Temp:  [98.3 F (36.8 C)-99.3 F (37.4 C)] 98.7 F (37.1 C) (12/09 0616) Pulse Rate:  [45-122] 88 (12/09 0814) Resp:  [14-21] 16 (12/09 0616) BP: (102-171)/(53-126) 102/64 (12/09 0814) SpO2:  [95 %-100 %] 95 % (12/09 0616) Last BM Date: 12/28/18 General:   Alert, male in NAD EENT:  HOH   Heart:  Regular rate , irreg rhythm;  Pulm: Normal respiratory effort   Abdomen:  Soft, nondistended, nontender.  Normal bowel sounds.          Psych:  Pleasant, cooperative.    Intake/Output from previous day: 12/08 0701 - 12/09 0700 In: 4578.2 [P.O.:480; I.V.:3398.2; IV Piggyback:699.9] Out: 850 [Urine:850] Intake/Output this shift: No intake/output data recorded.  Lab Results: Recent Labs    12/27/18 0522  12/28/18 0221 12/29/18 0517  WBC 15.5* 9.8 7.6  HGB 9.1* 8.1* 8.1*  HCT 29.5* 27.6* 27.0*  PLT 174 167 169   BMET Recent Labs    12/26/18 2139 12/27/18 0633 12/28/18 0221  NA 139 141 145  K 3.9 3.6 3.6  CL 111 110 115*  CO2 19* 21* 21*  GLUCOSE 215* 187* 195*  BUN 96* 89* 50*  CREATININE 2.13* 1.73* 1.17  CALCIUM 6.6* 7.4* 7.6*   LFT Recent Labs    12/26/18 1257  PROT 6.7  ALBUMIN 3.5  AST 24  ALT 20  ALKPHOS 47  BILITOT 0.3   PT/INR Recent Labs    12/26/18 1420  LABPROT 16.7*  INR 1.4*     Active Problems:   GI bleeding     LOS: 3 days   Tye Savoy ,NP 12/29/2018, 9:33 AM

## 2018-12-29 NOTE — TOC Initial Note (Signed)
Transition of Care [facility]) - Initial/Assessment Note    Patient Details  Name: Gerald [name] MRN: [id number] Date of Birth: 01/13/1926  Transition of Care [facility]) CM/SW Contact:    Dessa Phi, RN Phone Number: 12/29/2018, 2:29 PM  Clinical Narrative: Patient is blind, HOH.[facility]-rep aware-can return when stable. TC grand dtr Teresa-d/c plans to return-Has services of custodial level-living well @ home-nsg/aide.PT-no f/u. Grand dtr prefers PTAR @ d/c.                  Expected Discharge Plan: Home/Self Care Barriers to Discharge: Continued Medical Work up   Patient Goals and CMS Choice     Choice offered to / list presented to : Adult Children  Expected Discharge Plan and Services Expected Discharge Plan: Home/Self Care   Discharge Planning Services: CM Consult   Living arrangements for the past 2 months: [place]                                      Prior Living Arrangements/Services Living arrangements for the past 2 months: [place] Lives with:: Facility Resident Patient language and need for interpreter reviewed:: Yes Do you feel safe going back to the place where you live?: Yes      Need for Family Participation in Patient Care: No (Comment) Care giver support system in place?: Yes (comment) Current home services: Homehealth aide(Living well @ home-aide/nursing services-insulin,meds,walking.) Criminal Activity/Legal Involvement Pertinent to Current Situation/Hospitalization: No - Comment as needed  Activities of Daily Living Home Assistive Devices/Equipment: Blood pressure cuff, Eyeglasses, Grab bars around toilet, Grab bars in shower, Hand-held shower hose, Hospital bed, Nebulizer, Oxygen, CBG Meter, Scales, Walker (specify type)(front wheeled walker-[facility] will get necessary equipment for their residents) ADL Screening (condition at time of admission) Patient's cognitive ability adequate to safely  complete daily activities?: No Is the patient deaf or have difficulty hearing?: Yes(patient very HOH) Does the patient have difficulty seeing, even when wearing glasses/contacts?: Yes Does the patient have difficulty concentrating, remembering, or making decisions?: Yes Patient able to express need for assistance with ADLs?: Yes Does the patient have difficulty dressing or bathing?: Yes Independently performs ADLs?: No Communication: Independent Dressing (OT): Needs assistance Is this a change from baseline?: Pre-admission baseline Grooming: Needs assistance Is this a change from baseline?: Pre-admission baseline Feeding: Needs assistance Is this a change from baseline?: Pre-admission baseline Bathing: Needs assistance Is this a change from baseline?: Pre-admission baseline Toileting: Needs assistance Is this a change from baseline?: Pre-admission baseline In/Out Bed: Needs assistance Is this a change from baseline?: Pre-admission baseline Walks in Home: Needs assistance Is this a change from baseline?: Pre-admission baseline Does the patient have difficulty walking or climbing stairs?: Yes(secondary to weakness) Weakness of Legs: Both Weakness of Arms/Hands: None  Permission Sought/Granted Permission sought to share information with : Case Manager Permission granted to share information with : Yes, Verbal Permission Granted  Share Information with NAME: Jeri Modena grand dtr [phone]  Permission granted to share info w AGENCY: [facility]        Emotional Assessment Appearance:: Appears stated age Attitude/Demeanor/Rapport: Gracious Affect (typically observed): Accepting Orientation: : Oriented to Self Alcohol / Substance Use: Not Applicable Psych Involvement: No (comment)  Admission diagnosis:  AKI (acute kidney injury) ([place]) [N17.9] Atrial fibrillation with RVR ([place]) [I48.91] Gastrointestinal hemorrhage, unspecified gastrointestinal hemorrhage type  [K92.2] Sepsis, due to unspecified  organism, unspecified whether acute organ dysfunction present [facility]) [A41.9] Patient Active Problem List   Diagnosis Date Noted  . GI bleeding 12/26/2018  . Hearing loss of both ears 06/18/2012  . Macular degeneration 06/18/2012  . Reactive depression (situational) 06/18/2012  . UNSPECIFIED CARDIAC DYSRHYTHMIA 12/18/2009  . Type II or unspecified type diabetes mellitus with peripheral circulatory disorders, uncontrolled(250.72) 06/01/2008  . ANEMIA-NOS 06/01/2008  . UNSPECIFIED PREMATURE BEATS 06/01/2008  . COLONIC POLYPS, HX OF 06/01/2008  . ESOPHAGEAL STRICTURE 05/18/2007  . OTHER AND UNSPECIFIED HYPERLIPIDEMIA 05/10/2007  . ATHEROSCLEROSIS 04/16/2007  . GERD 04/16/2007  . ARTHRITIS 04/16/2007  . HELICOBACTER PYLORI INFECTION, HX OF 04/16/2007  . GASTRITIS 08/12/2006  . HIATAL HERNIA 08/12/2006  . HYPERTENSION 06/18/2006   PCP:  Pecola Lawless, MD Pharmacy:   [facility] [address], [place] - [address] [place] [address] Phone: [phone] Fax: [phone]  [facility] - [place], [place] - [address] AT [facility] [address] [place] [address] Phone: [phone] Fax: [phone]  [facility] - [place], [place] - [address] [place] [address] Phone: [phone] Fax: [phone]     Social Determinants of Health (SDOH) Interventions    Readmission Risk Interventions No flowsheet data found.

## 2018-12-29 NOTE — Progress Notes (Addendum)
PROGRESS NOTE    Gerald [name]  TKZ:[id number] DOB: Jun 20, 1925 DOA: 12/26/2018 PCP: Hendricks Limes, MD   Brief Narrative:  Patient is a 85 year old male with history of advanced dementia, diabetes-2, hypertension, diverticulosis, proximal A. fib on Eliquis who is from skilled nursing facility who was sent to the emergency department for fever, diarrhea, melanotic stools.  Patient has history of advanced dementia .  He was found to have new onset anemia, black stools.  Found to have acute kidney injury on prsentation.  CT abdomen/pelvis did not show any acute intra-abdominal findings.  Stools were guaiac positive.  He was also found in A. fib with RVR so started on Cardizem drip. GI  consulted.  Underwent EGD with finding of grade C esophagitis, food bezoar, large nonbleeding diverticulum, Cameron lesions.  Currently on PPI twice daily.  GI recommending EGD in 2 to 3 months.  Plan is to restart anticoagulation in 48 hours to allow healing for esophagitis and Cameron lesions. His heart rate is still in the range of 110-1 20 today.  Added oral Cardizem.  Pending PT/OT evaluation. Plan for discharge back to his facility if remains hemodynamically stable tomorrow.  Assessment & Plan:   Active Problems:   GI bleeding   Melanotic stools: FOBT was positive.  Presented with dark loose stools.  Baseline hemoglobin around 14 which is dropped to 8 .  He is on Eliquis, aspirin at skilled nursing facility. GI  consulted.  Underwent EGD with finding of grade C esophagitis, food bezoar, large nonbleeding diverticulum, Cameron lesions.  Currently on PPI twice daily.  GI recommending EGD in 2 to 3 months.  Plan is to restart anticoagulation in 48 hours to allow healing for esophagitis and Cameron lesions.  Acute blood loss anemia/ normocytic anemia: Likely secondary to GI bleed.  Hemoglobin this morning 8.1.  Continue to monitor.S/P IV iron infusion.Check CBC tomorrow  Fever/sepsis: Was febrile on  presentation.  Elevated lactic acid.  Had diarrhea.  GI pathogen panel pending. Now bowel movement at present. Started on ceftriaxone and Flagyl for possible colitis but CT did not show any evidence of inflammation.  We discontinued antibiotics . Leucocytosis resolved   Acute kidney injury: Presented with creatinine of 3 .  His creatinine was 1 on 11/12/2012.  Kidney function improved with IV fluids and back to baseline  Proximal A. fib: Found to be in A. fib on RVR on presentation.    He is on metoprolol which has been resumed at increased dose.  His heart rate is still in the range of 110-1 20 today.  Added oral Cardizem.   Elevated troponin: Denies any chest pain.  Could be asociated  with A. fib, AKI, possible sepsis.  Trend is flat.  Will not pursue any further intervention.  DM2 :  Continue sliding-scale insulin for now.  He is on insulin at baseline.  Hypothyroidism: Continue Synthyroid  Hypertension: On lisinopril, metoprolol at home.BP soft, will likely discontinue lisinopril permanently.  Added Cardizem.  Debility/deconditioning: Pending PT/OT evaluation         DVT prophylaxis: SCD Code Status: DNR Family Communication: Discussed with granddaughter on 12/27/2018 Disposition Plan: Back to skilled nursing facility tomorrow   Consultants: None GI  Procedures: None  Antimicrobials:  Anti-infectives (From admission, onward)   Start     Dose/Rate Route Frequency Ordered Stop   12/27/18 1400  cefTRIAXone (ROCEPHIN) 2 g in sodium chloride 0.9 % 100 mL IVPB  Status:  Discontinued     2 g  200 mL/hr over 30 Minutes Intravenous Every 24 hours 12/26/18 1808 12/28/18 1327   12/26/18 2200  metroNIDAZOLE (FLAGYL) IVPB 500 mg  Status:  Discontinued     500 mg 100 mL/hr over 60 Minutes Intravenous Every 8 hours 12/26/18 1808 12/28/18 1327   12/26/18 1430  cefTRIAXone (ROCEPHIN) 2 g in sodium chloride 0.9 % 100 mL IVPB     2 g 200 mL/hr over 30 Minutes Intravenous  Once 12/26/18 1421  12/26/18 1607   12/26/18 1430  metroNIDAZOLE (FLAGYL) IVPB 500 mg     500 mg 100 mL/hr over 60 Minutes Intravenous  Once 12/26/18 1421 12/26/18 1738      Subjective:  Patient seen and examined the bedside this morning.  Comfortable.  Hemodynamically stable but heart rate fluctuating from 110-120.  Denies any chest pain or shortness of breath.  No bloody bowel movements.  Objective: Vitals:   12/28/18 2012 12/29/18 0045 12/29/18 0616 12/29/18 0814  BP: 120/88 113/73 (!) 106/53 102/64  Pulse: 94 100 98 88  Resp: Temp: 98.3 F (36.8 C) 98.5 F (36.9 C) 98.7 F (37.1 C)   TempSrc:      SpO2: 97% 99% 95%   Weight:      Height:        Intake/Output Summary (Last 24 hours) at 12/29/2018 1048 Last data filed at 12/29/2018 0815 Gross per 24 hour  Intake 981.43 ml  Output 740 ml  Net 241.43 ml   Filed Weights   12/26/18 1321  Weight: 72.6 kg    Examination:  General exam: Appears calm and comfortable ,Not in distress,dementia HEENT:PERRL,Oral mucosa moist, Ear/Nose normal on gross exam Respiratory system: Bilateral equal air entry, normal vesicular breath sounds, no wheezes or crackles  Cardiovascular system: Afib with RVR. No JVD, murmurs, rubs, gallops or clicks. No pedal edema. Gastrointestinal system: Abdomen is nondistended, soft and nontender. No organomegaly or masses felt. Normal bowel sounds heard. Central nervous system: Alert and  oriented. No focal neurological deficits. Extremities: No edema, no clubbing ,no cyanosis, distal peripheral pulses palpable. Skin: No rashes, lesions or ulcers,no icterus ,no pallor  Data Reviewed: I have personally reviewed following labs and imaging studies  CBC: Recent Labs  Lab 12/26/18 1257 12/27/18 0522 12/28/18 0221 12/29/18 0517  WBC 21.9* 15.5* 9.8 7.6  NEUTROABS 19.6*  --  7.6 5.7  HGB 11.1* 9.1* 8.1* 8.1*  HCT 36.8* 29.5* 27.6* 27.0*  MCV 93.4 93.7 94.2 93.4  PLT 279 174 167 169   Basic Metabolic Panel:  Recent Labs  Lab 12/26/18 1257 12/26/18 1924 12/26/18 2139 12/27/18 0633 12/28/18 0221  NA 141 139 139 141 145  K 4.1 3.7 3.9 3.6 3.6  CL 101 106 111 110 115*  CO2 22 21* 19* 21* 21*  GLUCOSE 305* 256* 215* 187* 195*  BUN 105* 111* 96* 89* 50*  CREATININE 3.19* 2.37* 2.13* 1.73* 1.17  CALCIUM 8.4* 7.2* 6.6* 7.4* 7.6*   GFR: Estimated Creatinine Clearance: 37.1 mL/min (by C-G formula based on SCr of 1.17 mg/dL). Liver Function Tests: Recent Labs  Lab 12/26/18 1257  AST 24  ALT 20  ALKPHOS 47  BILITOT 0.3  PROT 6.7  ALBUMIN 3.5   Recent Labs  Lab 12/26/18 1257  LIPASE 35   No results for input(s): AMMONIA in the last 168 hours. Coagulation Profile: Recent Labs  Lab 12/26/18 1420  INR 1.4*   Cardiac Enzymes: No results for input(s): CKTOTAL, CKMB, CKMBINDEX, TROPONINI in the last 168  hours. BNP (last 3 results) No results for input(s): PROBNP in the last 8760 hours. HbA1C: Recent Labs    12/26/18 1924  HGBA1C 6.3*   CBG: Recent Labs  Lab 12/28/18 0751 12/28/18 1153 12/28/18 1342 12/28/18 2211 12/29/18 0726  GLUCAP 243* 210* 128* 181* 191*   Lipid Profile: No results for input(s): CHOL, HDL, LDLCALC, TRIG, CHOLHDL, LDLDIRECT in the last 72 hours. Thyroid Function Tests: No results for input(s): TSH, T4TOTAL, FREET4, T3FREE, THYROIDAB in the last 72 hours. Anemia Panel: Recent Labs    12/27/18 0522  VITAMINB12 220  FOLATE 5.0*  FERRITIN 59  TIBC 257  IRON 58  RETICCTPCT 1.9   Sepsis Labs: Recent Labs  Lab 12/26/18 1307 12/26/18 1507 12/27/18 0522  LATICACIDVEN 4.8* 3.6* 1.5    Recent Results (from the past 240 hour(s))  Blood Culture (routine x 2)     Status: None (Preliminary result)   Collection Time: 12/26/18  2:20 PM   Specimen: Right Antecubital; Blood  Result Value Ref Range Status   Specimen Description   Final    RIGHT ANTECUBITAL Performed at [facility], [address], [place], [place] [address]     Special Requests   Final    BOTTLES DRAWN AEROBIC AND ANAEROBIC Blood Culture adequate volume Performed at [facility], [address], [place], [place] [address]    Culture   Final    NO GROWTH 2 DAYS Performed at [facility], [address], [place], [place] [address]    Report Status PENDING  Incomplete  Urine culture     Status: Abnormal   Collection Time: 12/26/18  2:20 PM   Specimen: In/Out Cath Urine  Result Value Ref Range Status   Specimen Description   Final    IN/OUT CATH URINE Performed at [facility], [address] [place], [place] [address]    Special Requests   Final    NONE Performed at [facility], [address], [place], [place] [address]    Culture (A)  Final    <10,000 COLONIES/mL INSIGNIFICANT GROWTH Performed at [facility], [address] [place], [place] [address]    Report Status 12/28/2018 FINAL  Final  SARS CORONAVIRUS 2 (TAT 6-24 HRS) Nasopharyngeal Nasopharyngeal Swab     Status: None   Collection Time: 12/26/18  5:20 PM   Specimen: Nasopharyngeal Swab  Result Value Ref Range Status   SARS Coronavirus 2 NEGATIVE NEGATIVE Final    Comment: (NOTE) SARS-CoV-2 target nucleic acids are NOT DETECTED. The SARS-CoV-2 RNA is generally detectable in upper and lower respiratory specimens during the acute phase of infection. Negative results do not preclude SARS-CoV-2 infection, do not rule out co-infections with other pathogens, and should not be used as the sole basis for treatment or other patient management decisions. Negative results must be combined with clinical observations, patient history, and epidemiological information. The expected result is Negative. Fact Sheet for Patients: HairSlick.no Fact Sheet for Healthcare Providers: quierodirigir.com This test is not yet approved or cleared by the [place] FDA and   has been authorized for detection and/or diagnosis of SARS-CoV-2 by FDA under an Emergency Use Authorization (EUA). This EUA will remain  in effect (meaning this test can be used) for the duration of the COVID-19 declaration under Section 56 4(b)(1) of the Act, 21 U.S.C. section 360bbb-3(b)(1), unless the authorization is terminated or revoked sooner. Performed at [facility], [address] [place], [place] [address]   MRSA PCR Screening     Status: None   Collection Time: 12/27/18  5:11 PM   Specimen: Nasal Mucosa; Nasopharyngeal  Result Value Ref Range Status   MRSA by PCR NEGATIVE NEGATIVE Final    Comment:        The GeneXpert MRSA Assay (FDA approved for NASAL specimens only), is one component of a comprehensive MRSA colonization surveillance program. It is not intended to diagnose MRSA infection nor to guide or monitor treatment for MRSA infections. Performed at [facility], [address] [place], [place] [address]          Radiology Studies: No results found.      Scheduled Meds: . Chlorhexidine Gluconate Cloth  6 each Topical Daily  . diltiazem  120 mg Oral Daily  . insulin aspart  0-5 Units Subcutaneous QHS  . insulin aspart  0-9 Units Subcutaneous TID WC  . insulin glargine  5 Units Subcutaneous Daily  . levothyroxine  25 mcg Oral Q0600  . mouth rinse  15 mL Mouth Rinse BID  . metoprolol succinate  50 mg Oral Daily  . pantoprazole  40 mg Oral BID   Continuous Infusions:    LOS: 3 days    Time spent: 35 mins.More than 50% of that time was spent in counseling and/or coordination of care.      Burnadette PopAmrit Modena [place], MD [facility] Pager [phone]  If 7PM-7AM, please contact night-coverage [email or web address] Password [email or web address] 12/29/2018, 10:48 AM

## 2018-12-29 NOTE — Plan of Care (Signed)
Continue current POC 

## 2018-12-29 NOTE — Telephone Encounter (Signed)
Recall added to Epic for 3 month EGD with Dr Rush Landmark

## 2018-12-30 LAB — CBC WITH DIFFERENTIAL/PLATELET
Abs Immature Granulocytes: 0.06 10*3/uL (ref 0.00–0.07)
Basophils Absolute: 0 10*3/uL (ref 0.0–0.1)
Basophils Relative: 0 %
Eosinophils Absolute: 0.2 10*3/uL (ref 0.0–0.5)
Eosinophils Relative: 2 %
HCT: 27.4 % — ABNORMAL LOW (ref 39.0–52.0)
Hemoglobin: 8.2 g/dL — ABNORMAL LOW (ref 13.0–17.0)
Immature Granulocytes: 1 %
Lymphocytes Relative: 17 %
Lymphs Abs: 1.3 10*3/uL (ref 0.7–4.0)
MCH: 28.4 pg (ref 26.0–34.0)
MCHC: 29.9 g/dL — ABNORMAL LOW (ref 30.0–36.0)
MCV: 94.8 fL (ref 80.0–100.0)
Monocytes Absolute: 0.7 10*3/uL (ref 0.1–1.0)
Monocytes Relative: 10 %
Neutro Abs: 5.3 10*3/uL (ref 1.7–7.7)
Neutrophils Relative %: 70 %
Platelets: 153 10*3/uL (ref 150–400)
RBC: 2.89 MIL/uL — ABNORMAL LOW (ref 4.22–5.81)
RDW: 15.3 % (ref 11.5–15.5)
WBC: 7.6 10*3/uL (ref 4.0–10.5)
nRBC: 0.3 % — ABNORMAL HIGH (ref 0.0–0.2)

## 2018-12-30 LAB — GLUCOSE, CAPILLARY
Glucose-Capillary: 180 mg/dL — ABNORMAL HIGH (ref 70–99)
Glucose-Capillary: 311 mg/dL — ABNORMAL HIGH (ref 70–99)

## 2018-12-30 MED ORDER — DILTIAZEM HCL ER COATED BEADS 120 MG PO CP24: 120.0000 mg | ORAL_CAPSULE | Freq: Every day | ORAL | 1 refills | Status: AC

## 2018-12-30 MED ORDER — ELIQUIS 2.5 MG PO TABS: 2.5000 mg | ORAL_TABLET | Freq: Two times a day (BID) | ORAL | Status: AC

## 2018-12-30 MED ORDER — PANTOPRAZOLE SODIUM 40 MG PO TBEC: 40.0000 mg | DELAYED_RELEASE_TABLET | Freq: Two times a day (BID) | ORAL | 1 refills | Status: AC

## 2018-12-30 NOTE — Discharge Summary (Signed)
Physician Discharge Summary  Gerald [name] ZOX:[id number] DOB: March 26, 1925 DOA: 12/26/2018  PCP: Hendricks Limes, MD  Admit date: 12/26/2018 Discharge date: 12/30/2018  Admitted From: Home Disposition:  Home  Discharge Condition:Stable CODE STATUS:DNR Diet recommendation: Heart Healthy  Brief/Interim Summary:  Patient is a 85 year old male with history of advanced dementia, diabetes-2, hypertension, diverticulosis, proximal A. fib on Eliquis who is from skilled nursing facility who was sent to the emergency department for fever, diarrhea, melanotic stools.  Patient has history of advanced dementia .  He was found to have new onset anemia, black stools.  Found to have acute kidney injury on prsentation.  CT abdomen/pelvis did not show any acute intra-abdominal findings.  Stools were guaiac positive.  He was also found in A. fib with RVR so started on Cardizem drip. GI  consulted.  Underwent EGD with finding of grade C esophagitis, food bezoar, large nonbleeding diverticulum, Cameron lesions. Currently on PPI twice daily.  GI recommending EGD in 2 to 3 months. Plan is to restart anticoagulation in 48 hours post EGD to allow healing for esophagitis and Cameron lesions. His heart rate is well controlled today after Cardizem was added.  He is hemodynamically stable for discharge today.  Following problems were addressed during his hospitalization:  Melanotic stools: FOBT was positive.  Presented with dark loose stools.  Baseline hemoglobin around 14 which is dropped to 8 .  He is on Eliquis, aspirin at skilled nursing facility. GI  consulted.  Underwent EGD with finding of grade C esophagitis, food bezoar, large nonbleeding diverticulum, Cameron lesions.  Currently on PPI twice daily.  GI recommending EGD in 2 to 3 months.  Plan is to restart anticoagulation in 48 hours to allow healing for esophagitis and Cameron lesions.  Acute blood loss anemia/ normocytic anemia: Likely secondary to GI  bleed.  Hemoglobin this morning in the range of 8.  S/P IV iron infusion.Check CBC in a week.  Fever/sepsis: Was febrile on presentation.  Elevated lactic acid.  Had diarrhea.  GI pathogen panel pending. Now bowel movement at present. Started on ceftriaxone and Flagyl for possible colitis but CT did not show any evidence of inflammation.  We discontinued antibiotics . Leucocytosis resolved   Acute kidney injury: Presented with creatinine of 3 .  His creatinine was 1 on 11/12/2012.  Kidney function improved with IV fluids and back to baseline  Paroxysmal Atrial  fib: Found to be in A. fib on RVR on presentation. He is on metoprolol which has been resumed . Added oral Cardizem. HR currently well controlled.  Elevated troponin: Denies any chest pain.  Could be asociated  with A. fib, AKI, possible sepsis.  Trend is flat.  Will not pursue any further intervention.  DM2 :  CBG well controlled. He is on insulin at home.  Hypothyroidism: Continue Synthyroid  Hypertension: On lisinopril, metoprolol at home.BP soft, so discontinue lisinopril discontinued permanently.  Added Cardizem.  Debility/deconditioning:  PT/OT evaluation done    Discharge Diagnoses:  Active Problems:   GI bleeding    Discharge Instructions  Discharge Instructions    Diet - low sodium heart healthy   Complete by: As directed    Discharge instructions   Complete by: As directed    1)Please follow up with your PCP in a week.Do a CBC test during the follow up. 2)Take prescribed medications as instructed. 3)We are recommending to stop aspirin. Start eliquis from tomorrow only.   Increase activity slowly   Complete by: As directed  Allergies as of 12/30/2018   No Known Allergies     Medication List    STOP taking these medications   aspirin EC 81 MG tablet   lisinopril 5 MG tablet Commonly known as: ZESTRIL     TAKE these medications   diltiazem 120 MG 24 hr capsule Commonly known as:  CARDIZEM CD Take 1 capsule (120 mg total) by mouth daily. Start taking on: December 31, 2018   Eliquis 2.5 MG Tabs tablet Generic drug: apixaban Take 1 tablet (2.5 mg total) by mouth 2 (two) times daily. Start on 12/31/18 What changed: additional instructions   Insulin Pen Needle 31G X 5 MM Misc Commonly known as: B-D UF III MINI PEN NEEDLES USE TO TEST BLOOD SUGARS ICD 10 E11 59   Lantus 100 UNIT/ML injection Generic drug: insulin glargine Inject 10 Units into the skin daily.   levothyroxine 25 MCG tablet Commonly known as: SYNTHROID Take 25 mcg by mouth every morning.   metoprolol succinate 25 MG 24 hr tablet Commonly known as: TOPROL-XL Take 25 mg by mouth daily.   ONE TOUCH ULTRA 2 w/Device Kit Test blood sugar daily. Dx Code: 250.72   ONE TOUCH ULTRA TEST test strip Generic drug: glucose blood CHECK BLOOD GLUCOSE (SUGAR) DAILY   OneTouch Delica Lancets 27O Misc CHECK BLOOD GLUCOSE DAILY E11  59   pantoprazole 40 MG tablet Commonly known as: PROTONIX Take 1 tablet (40 mg total) by mouth 2 (two) times daily.   pravastatin 10 MG tablet Commonly known as: PRAVACHOL Take 10 mg by mouth daily.   sertraline 50 MG tablet Commonly known as: ZOLOFT Take 50 mg by mouth daily.      Follow-up Information    Hendricks Limes, MD. Schedule an appointment as soon as possible for a visit in 1 week(s).   Specialty: Internal Medicine Contact information: [place] [address] [phone]          No Known Allergies  Consultations:  GI   Procedures/Studies: CT ABDOMEN PELVIS WO CONTRAST  Result Date: 12/26/2018 CLINICAL DATA:  Diarrhea since yesterday evening. Complains of body aches in loss of appetite EXAM: CT ABDOMEN AND PELVIS WITHOUT CONTRAST TECHNIQUE: Multidetector CT imaging of the abdomen and pelvis was performed following the standard protocol without IV contrast. COMPARISON:  None. FINDINGS: Lower chest: No acute abnormality.  Hepatobiliary: No focal liver abnormality is seen. No gallstones, gallbladder wall thickening, or biliary dilatation. Pancreas: Unremarkable. No pancreatic ductal dilatation or surrounding inflammatory changes. Spleen: Normal in size without focal abnormality. Adrenals/Urinary Tract: Normal adrenal glands. Cystic structure within upper pole of the right kidney measures 1.9 cm. This is incompletely characterized without IV contrast. No hydronephrosis or mass identified bilaterally. Urinary bladder appears normal. Stomach/Bowel: Hiatal hernia. Moderate distension of the gastric lumen. No abnormal bowel wall thickening, inflammation or distension identified. The appendix is not confidently identified. No secondary signs however of acute appendicitis. A few scattered colonic diverticula noted without acute inflammation. Vascular/Lymphatic: Aortic atherosclerosis. No aneurysm. No abdominopelvic adenopathy identified. Reproductive: Prostate is unremarkable. Other: No abdominal wall hernia or abnormality. No abdominopelvic ascites. Musculoskeletal: Thoracolumbar scoliosis and multi level degenerative disc disease identified. No acute or suspicious osseous findings identified. IMPRESSION: 1. No acute findings within the abdomen or pelvis. 2. Hiatal hernia. 3. Aortic atherosclerosis. The Aortic Atherosclerosis (ICD10-I70.0). Electronically Signed   By: Kerby Moors M.D.   On: 12/26/2018 16:34   US Renal  Result Date: 12/27/2018 CLINICAL DATA:  Acute kidney injury EXAM:  RENAL / URINARY TRACT ULTRASOUND COMPLETE COMPARISON:  None. FINDINGS: Right Kidney: Renal measurements: 9.7 x 5.5 x 6.0 cm = volume: 170 mL . Echogenicity is mildly increased. No mass or hydronephrosis visualized. Left Kidney: Renal measurements: 9.3 x 5.7 x 6.9 cm = volume: Is 189 mL. Echogenicity within normal limits. No mass or hydronephrosis visualized. Bladder: Appears normal for degree of bladder distention. Other: None. IMPRESSION: No  hydronephrosis. Mildly increased echogenicity of the right kidney, possibly due to chronic medical renal disease. Electronically Signed   By: Ulyses Jarred M.D.   On: 12/27/2018 02:34   DG Chest Portable 1 View  Result Date: 12/26/2018 CLINICAL DATA:  Fever cough. EXAM: PORTABLE CHEST 1 VIEW COMPARISON:  11/07/2004. FINDINGS: Lung volumes are low. Cardiomediastinal contours are likely stable accounting for low lung volumes. Basilar opacity seen bilaterally. No signs of pleural effusion. Study limited by lordotic positioning with respect to assessment of lung bases. Post median sternotomy for CABG. No acute bone finding. IMPRESSION: 1. Low lung volumes with bibasilar opacity, likely atelectasis. 2. No visible pleural effusion or pneumothorax. Electronically Signed   By: Zetta Bills M.D.   On: 12/26/2018 14:29   ECHOCARDIOGRAM COMPLETE  Result Date: 12/27/2018   ECHOCARDIOGRAM REPORT   Patient Name:   KESHON MARKOVITZ Date of Exam: 12/27/2018 Medical Rec #:  [id number]        Height:       66.0 in Accession #:    [id number]       Weight:       160.0 lb Date of Birth:  1925/03/16         BSA:          1.82 m Patient Age:    47 years         BP:           112/62 mmHg Patient Gender: M                HR:           17 bpm. Exam Location:  Inpatient Procedure: 2D Echo, Cardiac Doppler and Color Doppler Indications:    Atrial Fibrillation 427.31  History:        Patient has no prior history of Echocardiogram examinations.                 Risk Factors:Hypertension, Diabetes, Dyslipidemia and Former                 Smoker.  Sonographer:    Paulita Fujita RDCS Referring Phys: Theola Sequin IMPRESSIONS  1. Left ventricular ejection fraction, by visual estimation, is 50 to 55%. The left ventricle has low normal function. There is no left ventricular hypertrophy.  2. Left ventricular diastolic function could not be evaluated.  3. The left ventricle has no regional wall motion abnormalities.  4. Global right ventricle  has moderately reduced systolic function.The right ventricular size is normal. No increase in right ventricular wall thickness.  5. Left atrial size was mildly dilated.  6. Right atrial size was normal.  7. Trivial pericardial effusion is present.  8. The mitral valve is grossly normal. Mild mitral valve regurgitation.  9. The tricuspid valve is normal in structure. Tricuspid valve regurgitation is mild. 10. The aortic valve is tricuspid. Aortic valve regurgitation is not visualized. Mild aortic valve sclerosis without stenosis. 11. The pulmonic valve was not well visualized. Pulmonic valve regurgitation is not visualized. FINDINGS  Left Ventricle: Left ventricular ejection fraction, by visual  estimation, is 50 to 55%. The left ventricle has low normal function. The left ventricle has no regional wall motion abnormalities. There is no left ventricular hypertrophy. The left ventricular diastology could not be evaluated due to atrial fibrillation. Left ventricular diastolic function could not be evaluated. Right Ventricle: The right ventricular size is normal. No increase in right ventricular wall thickness. Global RV systolic function is has moderately reduced systolic function. Left Atrium: Left atrial size was mildly dilated. Right Atrium: Right atrial size was normal in size Pericardium: Trivial pericardial effusion is present. Mitral Valve: The mitral valve is grossly normal. Mild mitral valve regurgitation. Tricuspid Valve: The tricuspid valve is normal in structure. Tricuspid valve regurgitation is mild. Aortic Valve: The aortic valve is tricuspid. . There is mild thickening and mild calcification of the aortic valve. Aortic valve regurgitation is not visualized. Mild aortic valve sclerosis is present, with no evidence of aortic valve stenosis. Mild aortic valve annular calcification. There is mild thickening of the aortic valve. There is mild calcification of the aortic valve. Pulmonic Valve: The pulmonic  valve was not well visualized. Pulmonic valve regurgitation is not visualized. Aorta: The aortic root, ascending aorta and aortic arch are all structurally normal, with no evidence of dilitation or obstruction. Pulmonary Artery: The pulmonary artery is not well seen. Venous: The inferior vena cava was not well visualized. IAS/Shunts: No atrial level shunt detected by color flow Doppler.  LEFT VENTRICLE PLAX 2D LVIDd:         3.90 cm LVIDs:         3.30 cm LV PW:         0.80 cm LV IVS:        0.80 cm LVOT diam:     2.00 cm LV SV:         22 ml LV SV Index:   11.76 LVOT Area:     3.14 cm  RIGHT VENTRICLE RV S prime:     9.90 cm/s TAPSE (M-mode): 1.0 cm LEFT ATRIUM             Index       RIGHT ATRIUM           Index LA diam:        3.80 cm 2.09 cm/m  RA Area:     12.30 cm LA Vol (A2C):   72.7 ml 39.97 ml/m RA Volume:   23.70 ml  13.03 ml/m LA Vol (A4C):   55.0 ml 30.24 ml/m LA Biplane Vol: 65.9 ml 36.23 ml/m  AORTIC VALVE LVOT Vmax:   81.30 cm/s LVOT Vmean:  54.300 cm/s LVOT VTI:    0.157 m  AORTA Ao Root diam: 3.00 cm TRICUSPID VALVE TR Peak grad:   22.8 mmHg TR Vmax:        239.00 cm/s  SHUNTS Systemic VTI:  0.16 m Systemic Diam: 2.00 cm  Buford Dresser MD Electronically signed by Buford Dresser MD Signature Date/Time: 12/27/2018/5:59:53 PM    Final       Subjective: Patient seen and examined the bedside this morning.  Hemodynamically stable for discharge.Called granddaughter on phone and discussed discharge planning.  Discharge Exam: Vitals:   12/30/18 0437 12/30/18 0949  BP: 109/71 (!) 99/55  Pulse: 68 83  Resp: 18   Temp: 98.4 F (36.9 C)   SpO2: 95%    Vitals:   12/29/18 1503 12/29/18 2034 12/30/18 0437 12/30/18 0949  BP:  (!) 110/58 109/71 (!) 99/55  Pulse: 94 77 68 83  Resp:  16 18   Temp:  98.9 F (37.2 C) 98.4 F (36.9 C)   TempSrc:      SpO2:  96% 95%   Weight:      Height:        General: Pt is alert, awake, not in acute distress Cardiovascular: A. fib,  S1/S2 +, no rubs, no gallops Respiratory: CTA bilaterally, no wheezing, no rhonchi Abdominal: Soft, NT, ND, bowel sounds + Extremities: no edema, no cyanosis    The results of significant diagnostics from this hospitalization (including imaging, microbiology, ancillary and laboratory) are listed below for reference.     Microbiology: Recent Results (from the past 240 hour(s))  Blood Culture (routine x 2)     Status: None (Preliminary result)   Collection Time: 12/26/18  2:20 PM   Specimen: Right Antecubital; Blood  Result Value Ref Range Status   Specimen Description   Final    RIGHT ANTECUBITAL Performed at [facility] [address] [place], [place] [address]    Special Requests   Final    BOTTLES DRAWN AEROBIC AND ANAEROBIC Blood Culture adequate volume Performed at [facility] [address] [place], [place] [address]    Culture   Final    NO GROWTH 4 DAYS Performed at [facility], [place] [address] [place], [place] [address]    Report Status PENDING  Incomplete  Urine culture     Status: Abnormal   Collection Time: 12/26/18  2:20 PM   Specimen: In/Out Cath Urine  Result Value Ref Range Status   Specimen Description   Final    IN/OUT CATH URINE Performed at [facility] [address], [place], [place] [address]    Special Requests   Final    NONE Performed at [facility], [place] [address], [place], [place] [address]    Culture (A)  Final    <10,000 COLONIES/mL INSIGNIFICANT GROWTH Performed at [facility] [address], [place], [place] [address]    Report Status 12/28/2018 FINAL  Final  SARS CORONAVIRUS 2 (TAT 6-24 HRS) Nasopharyngeal Nasopharyngeal Swab     Status: None   Collection Time: 12/26/18  5:20 PM   Specimen: Nasopharyngeal Swab  Result Value Ref Range Status   SARS Coronavirus 2 NEGATIVE NEGATIVE Final    Comment: (NOTE) SARS-CoV-2 target nucleic acids  are NOT DETECTED. The SARS-CoV-2 RNA is generally detectable in upper and lower respiratory specimens during the acute phase of infection. Negative results do not preclude SARS-CoV-2 infection, do not rule out co-infections with other pathogens, and should not be used as the sole basis for treatment or other patient management decisions. Negative results must be combined with clinical observations, patient history, and epidemiological information. The expected result is Negative. Fact Sheet for Patients: SugarRoll.be Fact Sheet for Healthcare Providers: https://www.woods-mathews.com/ This test is not yet approved or cleared by the [place] FDA and  has been authorized for detection and/or diagnosis of SARS-CoV-2 by FDA under an Emergency Use Authorization (EUA). This EUA will remain  in effect (meaning this test can be used) for the duration of the COVID-19 declaration under Section 56 4(b)(1) of the Act, 21 U.S.C. section 360bbb-3(b)(1), unless the authorization is terminated or revoked sooner. Performed at [facility], [place] [address], [place], [place] [address]   MRSA PCR Screening     Status: None   Collection Time: 12/27/18  5:11 PM   Specimen: Nasal  Mucosa; Nasopharyngeal  Result Value Ref Range Status   MRSA by PCR NEGATIVE NEGATIVE Final    Comment:        The GeneXpert MRSA Assay (FDA approved for NASAL specimens only), is one component of a comprehensive MRSA colonization surveillance program. It is not intended to diagnose MRSA infection nor to guide or monitor treatment for MRSA infections. Performed at [facility], [place] [address] [place], [place] [address]      Labs: BNP (last 3 results) No results for input(s): BNP in the last 8760 hours. Basic Metabolic Panel: Recent Labs  Lab 12/26/18 1257 12/26/18 1924 12/26/18 2139 12/27/18 0633 12/28/18 0221  NA 141 139 139 141 145  K 4.1  3.7 3.9 3.6 3.6  CL 101 106 111 110 115*  CO2 22 21* 19* 21* 21*  GLUCOSE 305* 256* 215* 187* 195*  BUN 105* 111* 96* 89* 50*  CREATININE 3.19* 2.37* 2.13* 1.73* 1.17  CALCIUM 8.4* 7.2* 6.6* 7.4* 7.6*   Liver Function Tests: Recent Labs  Lab 12/26/18 1257  AST 24  ALT 20  ALKPHOS 47  BILITOT 0.3  PROT 6.7  ALBUMIN 3.5   Recent Labs  Lab 12/26/18 1257  LIPASE 35   No results for input(s): AMMONIA in the last 168 hours. CBC: Recent Labs  Lab 12/26/18 1257 12/27/18 0522 12/28/18 0221 12/29/18 0517 12/30/18 0528  WBC 21.9* 15.5* 9.8 7.6 7.6  NEUTROABS 19.6*  --  7.6 5.7 5.3  HGB 11.1* 9.1* 8.1* 8.1* 8.2*  HCT 36.8* 29.5* 27.6* 27.0* 27.4*  MCV 93.4 93.7 94.2 93.4 94.8  PLT 279 174 167 169 153   Cardiac Enzymes: No results for input(s): CKTOTAL, CKMB, CKMBINDEX, TROPONINI in the last 168 hours. BNP: Invalid input(s): POCBNP CBG: Recent Labs  Lab 12/29/18 0726 12/29/18 1204 12/29/18 1630 12/29/18 2036 12/30/18 0807  GLUCAP 191* 156* 180* 115* 180*   D-Dimer No results for input(s): DDIMER in the last 72 hours. Hgb A1c No results for input(s): HGBA1C in the last 72 hours. Lipid Profile No results for input(s): CHOL, HDL, LDLCALC, TRIG, CHOLHDL, LDLDIRECT in the last 72 hours. Thyroid function studies No results for input(s): TSH, T4TOTAL, T3FREE, THYROIDAB in the last 72 hours.  Invalid input(s): FREET3 Anemia work up No results for input(s): VITAMINB12, FOLATE, FERRITIN, TIBC, IRON, RETICCTPCT in the last 72 hours. Urinalysis    Component Value Date/Time   COLORURINE YELLOW 12/26/2018 1257   APPEARANCEUR CLEAR 12/26/2018 1257   LABSPEC 1.018 12/26/2018 1257   PHURINE 5.0 12/26/2018 1257   GLUCOSEU 150 (A) 12/26/2018 1257   HGBUR NEGATIVE 12/26/2018 1257   BILIRUBINUR NEGATIVE 12/26/2018 1257   KETONESUR 5 (A) 12/26/2018 1257   PROTEINUR NEGATIVE 12/26/2018 1257   UROBILINOGEN 1.0 11/12/2012 1848   NITRITE NEGATIVE 12/26/2018 1257    LEUKOCYTESUR NEGATIVE 12/26/2018 1257   Sepsis Labs Invalid input(s): PROCALCITONIN,  WBC,  LACTICIDVEN Microbiology Recent Results (from the past 240 hour(s))  Blood Culture (routine x 2)     Status: None (Preliminary result)   Collection Time: 12/26/18  2:20 PM   Specimen: Right Antecubital; Blood  Result Value Ref Range Status   Specimen Description   Final    RIGHT ANTECUBITAL Performed at [facility], [place] [address], [place], [place] [address]    Special Requests   Final    BOTTLES DRAWN AEROBIC AND ANAEROBIC Blood Culture adequate volume Performed at [facility] [address], [place], [place] [address]  Culture   Final    NO GROWTH 4 DAYS Performed at [facility], [place] [address], [place], [place] [address]    Report Status PENDING  Incomplete  Urine culture     Status: Abnormal   Collection Time: 12/26/18  2:20 PM   Specimen: In/Out Cath Urine  Result Value Ref Range Status   Specimen Description   Final    IN/OUT CATH URINE Performed at [facility] [address] [place], [place] [address]    Special Requests   Final    NONE Performed at [facility], [place] [address], [place], [place] [address]    Culture (A)  Final    <10,000 COLONIES/mL INSIGNIFICANT GROWTH Performed at [facility] [address] [place], [place] [address]    Report Status 12/28/2018 FINAL  Final  SARS CORONAVIRUS 2 (TAT 6-24 HRS) Nasopharyngeal Nasopharyngeal Swab     Status: None   Collection Time: 12/26/18  5:20 PM   Specimen: Nasopharyngeal Swab  Result Value Ref Range Status   SARS Coronavirus 2 NEGATIVE NEGATIVE Final    Comment: (NOTE) SARS-CoV-2 target nucleic acids are NOT DETECTED. The SARS-CoV-2 RNA is generally detectable in upper and lower respiratory specimens during the acute phase of infection. Negative results do not preclude SARS-CoV-2 infection, do not rule  out co-infections with other pathogens, and should not be used as the sole basis for treatment or other patient management decisions. Negative results must be combined with clinical observations, patient history, and epidemiological information. The expected result is Negative. Fact Sheet for Patients: SugarRoll.be Fact Sheet for Healthcare Providers: https://www.woods-mathews.com/ This test is not yet approved or cleared by the [place] FDA and  has been authorized for detection and/or diagnosis of SARS-CoV-2 by FDA under an Emergency Use Authorization (EUA). This EUA will remain  in effect (meaning this test can be used) for the duration of the COVID-19 declaration under Section 56 4(b)(1) of the Act, 21 U.S.C. section 360bbb-3(b)(1), unless the authorization is terminated or revoked sooner. Performed at [facility], [place] [address] [place], [place] [address]   MRSA PCR Screening     Status: None   Collection Time: 12/27/18  5:11 PM   Specimen: Nasal Mucosa; Nasopharyngeal  Result Value Ref Range Status   MRSA by PCR NEGATIVE NEGATIVE Final    Comment:        The GeneXpert MRSA Assay (FDA approved for NASAL specimens only), is one component of a comprehensive MRSA colonization surveillance program. It is not intended to diagnose MRSA infection nor to guide or monitor treatment for MRSA infections. Performed at [facility], [place] [address], [place], [place] [address]     Please note: You were cared for by a hospitalist during your hospital stay. Once you are discharged, your primary care physician will handle any further medical issues. Please note that NO REFILLS for any discharge medications will be authorized once you are discharged, as it is imperative that you return to your primary care physician (or establish a relationship with a primary care physician if you do not have one) for your post hospital  discharge needs so that they can reassess your need for medications and monitor your lab values.    Time coordinating discharge: 40 minutes  SIGNED:   Shelly Coss, MD  [facility] 12/30/2018, 10:35 AM Pager [phone]  If 7PM-7AM, please contact night-coverage [email or web address] Password [email or web address]

## 2018-12-30 NOTE — Evaluation (Signed)
Occupational Therapy Evaluation Patient Details Name: Gerald [name] MRN: [id number] DOB: 29-Dec-1925 Today's Date: 12/30/2018    History of Present Illness 85 year old male with history of advanced dementia, diabetes-2, hypertension, diverticulosis, proximal A. fib on Eliquis who is from [facility] facility who was sent to the emergency department for fever, diarrhea, melanotic stools.  Pt admitted for new onset anemia, black stools, acute kidney injury   Clinical Impression   Pt admitted with the above diagnoses and presents with below problem list. Pt will benefit from continued acute OT to address the below listed deficits and maximize independence with basic ADLs prior to d/c to venue below. At baseline, pt ambulates with a walker, pt endorses needing assist with bathing/dressing. Pt currently min to max A with basic ADLs, min guard with functional mobility and transfers. Pt noted to be incontinent of bowel once EOB, pt unaware of this. Provided pericare assist. Tolerated session well. Pt up in recliner at end of session with chair alarm on.      Follow Up Recommendations  No OT follow up(back to [facility] where pt resides)    Equipment Recommendations  None recommended by OT    Recommendations for Other Services       Precautions / Restrictions Precautions Precautions: Fall Precaution Comments: hard of hearing Restrictions Weight Bearing Restrictions: No      Mobility Bed Mobility Overal bed mobility: Needs Assistance Bed Mobility: Supine to Sit     Supine to sit: Min guard;HOB elevated     General bed mobility comments: no physical assist. minguard for safety  Transfers Overall transfer level: Needs assistance Equipment used: Rolling walker (2 wheeled) Transfers: Sit to/from Stand Sit to Stand: Min guard         General transfer comment: min guard from safety, to/from EOB 3x then to recliner.     Balance Overall balance assessment: Needs assistance    Sitting balance-Leahy Scale: Good     Standing balance support: Bilateral upper extremity supported Standing balance-Leahy Scale: Poor Standing balance comment: requires UE support                           ADL either performed or assessed with clinical judgement   ADL Overall ADL's : Needs assistance/impaired Eating/Feeding: Set up;Sitting   Grooming: Moderate assistance;Sitting   Upper Body Bathing: Moderate assistance;Sitting   Lower Body Bathing: Maximal assistance;Sit to/from stand   Upper Body Dressing : Moderate assistance;Sitting   Lower Body Dressing: Maximal assistance;Sit to/from stand   Toilet Transfer: Min guard;Minimal assistance;Ambulation;Comfort height toilet;Grab bars;RW   Toileting- Clothing Manipulation and Hygiene: Moderate assistance;Sit to/from stand   Tub/ Shower Transfer: Min guard;Minimal assistance;Ambulation;Rolling walker   Functional mobility during ADLs: Min guard;Rolling walker General ADL Comments: Found to be incontenint of stool upon coming to EOB, pt unaware. Pt min guard with functional mobility/transfers, some assist for safety along route likely due to baseline vision. Assist for ADLs more for cognitive demands of tasks than balance/strength. Suspect he is not too from from his baseline with ADLs.      Vision         Perception     Praxis      Pertinent Vitals/Pain Pain Assessment: No/denies pain     Hand Dominance     Extremity/Trunk Assessment Upper Extremity Assessment Upper Extremity Assessment: Generalized weakness;Overall [facility] for tasks assessed   Lower Extremity Assessment Lower Extremity Assessment: Defer to PT evaluation       Communication  Communication Communication: HOH   Cognition Arousal/Alertness: Awake/alert Behavior During Therapy: WFL for tasks assessed/performed;Flat affect Overall Cognitive Status: History of cognitive impairments - at baseline                                  General Comments: pleasant and cooperative, able to follow commands   General Comments       Exercises     Shoulder Instructions      Home Living Family/patient expects to be discharged to:: Assisted living                             Home Equipment: Walker - 2 wheels          Prior Functioning/Environment Level of Independence: Needs assistance  Gait / Transfers Assistance Needed: rw at baseline.  ADL's / Homemaking Assistance Needed: pt endorses assist needed with bathing/dressing PTA            OT Problem List: Impaired balance (sitting and/or standing);Decreased knowledge of use of DME or AE;Decreased knowledge of precautions;Decreased activity tolerance      OT Treatment/Interventions: Self-care/ADL training;Therapeutic exercise;DME and/or AE instruction;Therapeutic activities;Patient/family education;Balance training    OT Goals(Current goals can be found in the care plan section) Acute Rehab OT Goals Patient Stated Goal: not stated OT Goal Formulation: With patient Time For Goal Achievement: 01/13/19 Potential to Achieve Goals: Good ADL Goals Pt Will Perform Grooming: with min assist;sitting Pt Will Perform Upper Body Bathing: with min assist;sitting Pt Will Perform Lower Body Bathing: with min assist;sit to/from stand Pt Will Transfer to Toilet: with min assist;ambulating Pt Will Perform Toileting - Clothing Manipulation and hygiene: with min assist;sit to/from stand  OT Frequency: Min 2X/week   Barriers to D/C:            Co-evaluation              AM-PAC OT "6 Clicks" Daily Activity     Outcome Measure Help from another person eating meals?: None Help from another person taking care of personal grooming?: A Lot Help from another person toileting, which includes using toliet, bedpan, or urinal?: A Lot Help from another person bathing (including washing, rinsing, drying)?: A Lot Help from another person to put on and taking  off regular upper body clothing?: A Lot Help from another person to put on and taking off regular lower body clothing?: A Lot 6 Click Score: 14   End of Session Equipment Utilized During Treatment: Rolling walker;Gait belt  Activity Tolerance: Patient tolerated treatment well Patient left: in chair;with call bell/phone within reach;with chair alarm set  OT Visit Diagnosis: Unsteadiness on feet (R26.81);Muscle weakness (generalized) (M62.81)                Time: 8756-4332 OT Time Calculation (min): 19 min Charges:  OT General Charges $OT Visit: 1 Visit OT Evaluation $OT Eval Low Complexity: [place], OT Acute Rehabilitation Services Pager: [phone] Office: [phone]   Hortencia Pilar 12/30/2018, 11:36 AM

## 2018-12-30 NOTE — TOC Transition Note (Signed)
Transition of Care [facility]) - CM/SW Discharge Note   Patient Details  Name: Gerald [name] MRN: [id number] Date of Birth: 03-27-1925  Transition of Care [facility]) CM/SW Contact:  Dessa Phi, RN Phone Number: 12/30/2018, 11:07 AM   Clinical Narrative:  Spoke to dtr [name] aware of d/c back to [facility] indep liv-she has custodial level asst. TC [facility] left vm w/rep of patient's return to rm C[facility]. [facility] called-Nsg aware. No further CM needs.    Final next level of care: Home/Self Care Barriers to Discharge: No Barriers Identified   Patient Goals and CMS Choice     Choice offered to / list presented to : Adult Children  Discharge Placement                       Discharge Plan and Services   Discharge Planning Services: CM Consult                                 Social Determinants of Health (SDOH) Interventions     Readmission Risk Interventions No flowsheet data found.

## 2018-12-31 ENCOUNTER — Encounter: Payer: Self-pay | Admitting: *Deleted

## 2018-12-31 LAB — CULTURE, BLOOD (ROUTINE X 2)
Culture: NO GROWTH
Special Requests: ADEQUATE

## 2019-02-08 ENCOUNTER — Ambulatory Visit: Payer: Medicare Other | Admitting: Nurse Practitioner

## 2019-02-17 ENCOUNTER — Ambulatory Visit (INDEPENDENT_AMBULATORY_CARE_PROVIDER_SITE_OTHER): Payer: Medicare Other | Admitting: Nurse Practitioner

## 2019-02-17 ENCOUNTER — Encounter: Payer: Self-pay | Admitting: Nurse Practitioner

## 2019-02-17 VITALS — BP 108/62 | HR 62 | Temp 97.7°F | Ht 66.0 in | Wt 153.0 lb

## 2019-02-17 DIAGNOSIS — H9190 Unspecified hearing loss, unspecified ear: Secondary | ICD-10-CM

## 2019-02-17 DIAGNOSIS — K209 Esophagitis, unspecified without bleeding: Secondary | ICD-10-CM

## 2019-02-17 NOTE — Progress Notes (Signed)
IMPRESSION and PLAN:     86 yo male here for follow up on food impaction and GI bleed from December. He is extremely hard of hearing and cannot understand me despite nearly yelling. He needs repeat EGD in early March. I cannot arrange this today given his inability to hear me. He will need to return with his hearing aids and / or family member or someone from [place].      HPI:    Primary GI: Harl Bowie, MD   Chief complaint : hospital follow up on GI bleed   Patient is a 86 yo male who was hospitalized in December with FOBT + anemia on Eliquis, Afib with RVR, AKI, DKA.  Hgb declined to 8.1 but remained stable without need for transfusion. 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 Acquired deformity in the gastric antrum but no evidence of PUD. No other gross lesions in the stomach. Duodenal deformity at apex of bulb. Non-bleeding duodenal diverticulum in D1/D2 sweep. No gross lesions in the second portion of the duodenum.  Patient is here for hospital follow up. He was brought from [facility]. He isn't accompanied by anyone. He is unable to hear most anything I say despite me nearly yelling. He left his hearing aids at home.   Mr. Old says that he feels fine   Review of systems:     No chest pain, no SOB, no fevers, no urinary sx   Past Medical History:  Diagnosis Date  . Anemia   . Diabetes mellitus   . Hypertension   . Macular degeneration    Dr Zigmund Daniel  . Stricture    PMH OF    Patient's surgical history, family medical history, social history, medications and allergies were all reviewed in Epic    Current Outpatient Medications  Medication Sig Dispense Refill  . Blood Glucose Monitoring Suppl (ONE TOUCH ULTRA 2)  W/DEVICE KIT Test blood sugar daily. Dx Code: 250.72 1 each 0  . diltiazem (CARDIZEM CD) 120 MG 24 hr capsule Take 1 capsule (120 mg total) by mouth daily. 30 capsule 1  . ELIQUIS 2.5 MG TABS tablet Take 1 tablet (2.5 mg total) by mouth 2 (two) times daily. Start on 12/31/18 60 tablet   . Insulin Pen Needle (B-D UF III MINI PEN NEEDLES) 31G X 5 MM MISC USE TO TEST BLOOD SUGARS ICD 10 E11 59 100 each 3  . LANTUS 100 UNIT/ML injection Inject 10 Units into the skin daily.    Marland Kitchen levothyroxine (SYNTHROID) 25 MCG tablet Take 25 mcg by mouth every morning.    . metoprolol succinate (TOPROL-XL) 25 MG 24 hr tablet Take 25 mg by mouth daily.    . ONE TOUCH ULTRA TEST test strip CHECK BLOOD GLUCOSE (SUGAR) DAILY 100 each 3  . ONETOUCH DELICA LANCETS 96G MISC CHECK BLOOD GLUCOSE DAILY E11  59 100 each 3  . pantoprazole (PROTONIX) 40 MG tablet Take 1 tablet (40 mg total) by mouth 2 (two) times daily. 60 tablet 1  . pravastatin (PRAVACHOL) 10 MG tablet Take 10 mg by mouth daily.    . sertraline (ZOLOFT) 50 MG tablet Take 50 mg by mouth daily.     No current facility-administered medications for this visit.  Physical Exam:     BP 108/62   Pulse 62   Temp 97.7 F (36.5 C)   Ht _0  (1.676 m)   Wt 153 lb (69.4 kg)   BMI 24.69 kg/m   GENERAL:  Well developed white male in NAD NEURO: Alert and oriented x 3, no focal neurologic deficits   Tye Savoy , NP 02/17/2019, 3:12 PM

## 2019-02-24 ENCOUNTER — Other Ambulatory Visit (INDEPENDENT_AMBULATORY_CARE_PROVIDER_SITE_OTHER): Payer: Medicare Other

## 2019-02-24 ENCOUNTER — Ambulatory Visit (INDEPENDENT_AMBULATORY_CARE_PROVIDER_SITE_OTHER): Payer: Medicare Other | Admitting: Nurse Practitioner

## 2019-02-24 ENCOUNTER — Encounter: Payer: Self-pay | Admitting: Nurse Practitioner

## 2019-02-24 VITALS — BP 108/62 | HR 72 | Temp 98.6°F | Ht 61.5 in | Wt 152.1 lb

## 2019-02-24 DIAGNOSIS — K922 Gastrointestinal hemorrhage, unspecified: Secondary | ICD-10-CM

## 2019-02-24 DIAGNOSIS — K253 Acute gastric ulcer without hemorrhage or perforation: Secondary | ICD-10-CM

## 2019-02-24 DIAGNOSIS — Z7901 Long term (current) use of anticoagulants: Secondary | ICD-10-CM

## 2019-02-24 DIAGNOSIS — H9190 Unspecified hearing loss, unspecified ear: Secondary | ICD-10-CM

## 2019-02-24 DIAGNOSIS — Z01818 Encounter for other preprocedural examination: Secondary | ICD-10-CM

## 2019-02-24 LAB — CBC
HCT: 37.6 % — ABNORMAL LOW (ref 39.0–52.0)
Hemoglobin: 12.2 g/dL — ABNORMAL LOW (ref 13.0–17.0)
MCHC: 32.5 g/dL (ref 30.0–36.0)
MCV: 85.6 fl (ref 78.0–100.0)
Platelets: 249 10*3/uL (ref 150.0–400.0)
RBC: 4.39 Mil/uL (ref 4.22–5.81)
RDW: 16.4 % — ABNORMAL HIGH (ref 11.5–15.5)
WBC: 6.7 10*3/uL (ref 4.0–10.5)

## 2019-02-24 LAB — IBC + FERRITIN
Ferritin: 25 ng/mL (ref 22.0–322.0)
Iron: 41 ug/dL — ABNORMAL LOW (ref 42–165)
Saturation Ratios: 14.1 % — ABNORMAL LOW (ref 20.0–50.0)
Transferrin: 207 mg/dL — ABNORMAL LOW (ref 212.0–360.0)

## 2019-02-24 NOTE — Progress Notes (Signed)
IMPRESSION and PLAN:    1. 86 yo male recently hospitalized with upper GI bleed on Eliquis.  Oozing esophagitis /Cameron's lesions on inpatient EGD. No further bleeding --continue BID PPI for now --Needs repeat EGD early March to ensure mucosal healing. The risks and benefits of EGD were discussed and the patient agrees to proceed.  --Unable to elevate HOB. Asked him to at least prop himself on pillows  2. Afib on Eliquis.  --Hold Eliquis for 2 days before procedure - will instruct when and how to resume after procedure. Patient understands that there is a low but real risk of cardiovascular event such as heart attack, stroke, or embolism /  thrombosis while off blood thinner. The patient consents to proceed. Will communicate by phone or EMR with patient's prescribing provider to confirm that holding Eliquis is reasonable in this case.   3. ABL anemia. Hgb 11 >>> 8.2. Got IV iron.  -CBC, ferritin, TIBC today   4. AKI, resolved  5. Elevated troponin ( in hospital) and possibly secondary to A. fib, a KIA, possibly sepsis.  Flat trend, intervention/further evaluation was not pursued  6. Very hard of hearing.   HPI:    Primary GI: Dr. Silverio Decamp ( hospital)  Chief complaint : Hospital follow-up on GI bleed   Gerald [name] is a 86 y.o. male with a pmh significant for but not limited to hard of hearing, A. Fib, diabetes, HTN, diverticulosis.  He was admitted to the hospital early December for upper GI bleed on Eliquis/sepsis / AKI and DKA.  It appears source of fever was never determined.  Noncontrast CT scan without acute findings.  Blood cultures negative.  No evidence for UTI.  There were no recent baseline labs to compare but his presenting hemoglobin was 11, it declined to 8.2.  He was not transfused blood did receive an iron infusion.  Inpatient EGD showed food in the esophagus which we removed.  There was LA grade C esophagitis with bleeding and oozing, Cameron's lesions  at the region of the hiatal hernia, antral and duodenal deformities, see full report below.  Patient is here for follow-up.  He is very hard of hearing, has a representative with him from [facility] facility.  Gerald [name] has no complaints.  No blood in stool/black stool, no abdominal pain, no nausea or vomiting.  He is taking Protonix twice daily.  He is back on Eliquis.  Data Reviewed:  EGD 12/28/18 Foodstuffs were noted in the middle esophagus and in the distal esophagus. Removal was successful by suction via endoscope and maneuvering food bezoar into stomach gently. This revealed LA Grade C esophagitis with bleeding/oozing noted. - 3 cm hiatal hernia. 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 Acquired deformity in the gastric antrum but no evidence of PUD. No other gross lesions in the stomach. - Duodenal deformity at apex of bulb. Non-bleeding duodenal diverticulum in D1/D2 sweep. No gross lesions in the second portion of the duodenum. Impression: Moderate (conscious) sedation was administered by the endoscopy nurse and supervised by the endoscopist. The following parameters were monitored: oxygen saturation, heart rate, blood pressure, and response to care. Total physician intraservice time was 20 minutes.  H.pylori IgG negative  Past Medical History:  Diagnosis Date  . Anemia   . Diabetes mellitus   . Hypertension   . Macular degeneration  Dr Zigmund Daniel  . Stricture    PMH OF    Patient's surgical history, family medical history, social history, medications and allergies were all reviewed in Epic   Creatinine clearance cannot be calculated (Patient's most recent lab result is older than the maximum 21 days allowed.)  Current Outpatient Medications  Medication Sig Dispense Refill  . Blood Glucose Monitoring Suppl (ONE TOUCH ULTRA 2) W/DEVICE KIT Test blood sugar daily. Dx  Code: 250.72 1 each 0  . diltiazem (CARDIZEM CD) 120 MG 24 hr capsule Take 1 capsule (120 mg total) by mouth daily. 30 capsule 1  . ELIQUIS 2.5 MG TABS tablet Take 1 tablet (2.5 mg total) by mouth 2 (two) times daily. Start on 12/31/18 60 tablet   . Insulin Pen Needle (B-D UF III MINI PEN NEEDLES) 31G X 5 MM MISC USE TO TEST BLOOD SUGARS ICD 10 E11 59 100 each 3  . LANTUS 100 UNIT/ML injection Inject 10 Units into the skin daily.    Marland Kitchen levothyroxine (SYNTHROID) 25 MCG tablet Take 25 mcg by mouth every morning.    . metoprolol succinate (TOPROL-XL) 25 MG 24 hr tablet Take 25 mg by mouth daily.    . ONE TOUCH ULTRA TEST test strip CHECK BLOOD GLUCOSE (SUGAR) DAILY 100 each 3  . ONETOUCH DELICA LANCETS 32Z MISC CHECK BLOOD GLUCOSE DAILY E11  59 100 each 3  . pantoprazole (PROTONIX) 40 MG tablet Take 1 tablet (40 mg total) by mouth 2 (two) times daily. 60 tablet 1  . pravastatin (PRAVACHOL) 10 MG tablet Take 10 mg by mouth daily.    . sertraline (ZOLOFT) 50 MG tablet Take 50 mg by mouth daily.     No current facility-administered medications for this visit.    Physical Exam:     BP 108/62 (BP Location: Left Arm, Patient Position: Sitting, Cuff Size: Normal)   Pulse 72 Comment: irregular  Temp 98.6 F (37 C)   Ht 5' 1.5" (1.562 m) Comment: height measured without shoes  Wt 152 lb 2 oz (69 kg)   BMI 28.28 kg/m   GENERAL:  Pleasant male in NAD PSYCH: : Cooperative, normal affect CARDIAC:  RRR,  ABDOMEN:  Nondistended, soft, nontender. Examined in chair.  NEURO: Alert and oriented x 3, no focal neurologic deficits   Tye Savoy , NP 02/24/2019, 1:34 PM

## 2019-02-24 NOTE — Patient Instructions (Addendum)
If you are age 84 or older, your body mass index should be between 23-30. Your Body mass index is 28.28 kg/m. If this is out of the aforementioned range listed, please consider follow up with your Primary Care Provider.  If you are age 74 or younger, your body mass index should be between 19-25. Your Body mass index is 28.28 kg/m. If this is out of the aformentioned range listed, please consider follow up with your Primary Care Provider.   You have been scheduled for an endoscopy. Please follow written instructions given to you at your visit today. If you use inhalers (even only as needed), please bring them with you on the day of your procedure. Your physician has requested that you go to www.startemmi.com and enter the access code given to you at your visit today. This web site gives a general overview about your procedure. However, you should still follow specific instructions given to you by our office regarding your preparation for the procedure.  Your provider has requested that you go to the basement level for lab work before leaving today. Press "B" on the elevator. The lab is located at the first door on the left as you exit the elevator.   You will be contacted by our office prior to your procedure for directions on holding your Eliquis.  If you do not hear from our office 1 week prior to your scheduled procedure, please call [phone] to discuss.   Thank you for choosing me and [facility].   Willette Cluster, NP

## 2019-02-28 ENCOUNTER — Telehealth: Payer: Self-pay

## 2019-02-28 NOTE — Progress Notes (Signed)
Reviewed and agree with documentation and assessment and plan. [name] , MD   

## 2019-02-28 NOTE — Progress Notes (Signed)
Reviewed and agree with documentation and assessment and plan. K. Veena Adalida Garver , MD   

## 2019-02-28 NOTE — Telephone Encounter (Signed)
  [facility] [address] [place], [place]  [address] Phone:  [phone]   Fax:  [phone]   02/28/2019   RE:      Gerald [name] DOB:   1925/05/26 MRN:   [id number]   Dear Ms. Neale Burly, NP,    We have scheduled the above patient for an endoscopic procedure. Our records show that he is on anticoagulation therapy.   Please advise as to whether the patient may come off his therapy of Eliquis two days prior to the EGD procedure, which is scheduled for 04/07/19.  Please fax back/ or route to [place], [place] at [phone].   Sincerely,    Priscella Mann, RMA

## 2019-03-08 NOTE — Telephone Encounter (Signed)
Spoke with Luanna Cole (nurse) at [facility] regarding holding Eliquis two days prior to procedure. Per NP Neale Burly okay to hold.

## 2019-03-08 NOTE — Telephone Encounter (Signed)
Ok thanks 

## 2019-04-05 ENCOUNTER — Other Ambulatory Visit (HOSPITAL_COMMUNITY)
Admission: RE | Admit: 2019-04-05 | Discharge: 2019-04-05 | Disposition: A | Discharge status: Home / Self Care | Payer: Medicare Other | Source: Ambulatory Visit | Attending: Gastroenterology | Admitting: Gastroenterology

## 2019-04-05 DIAGNOSIS — Z01812 Encounter for preprocedural laboratory examination: Secondary | ICD-10-CM | POA: Diagnosis present

## 2019-04-05 DIAGNOSIS — Z20822 Contact with and (suspected) exposure to covid-19: Secondary | ICD-10-CM | POA: Diagnosis not present

## 2019-04-05 LAB — SARS CORONAVIRUS 2 (TAT 6-24 HRS): SARS Coronavirus 2: NEGATIVE

## 2019-04-07 ENCOUNTER — Ambulatory Visit (AMBULATORY_SURGERY_CENTER): Payer: Medicare Other | Admitting: Gastroenterology

## 2019-04-07 ENCOUNTER — Other Ambulatory Visit: Payer: Self-pay

## 2019-04-07 ENCOUNTER — Encounter: Payer: Self-pay | Admitting: Gastroenterology

## 2019-04-07 ENCOUNTER — Encounter: Payer: Medicare Other | Admitting: Gastroenterology

## 2019-04-07 VITALS — BP 110/73 | HR 71 | Temp 97.5°F | Resp 16 | Ht 61.0 in | Wt 152.0 lb

## 2019-04-07 DIAGNOSIS — K449 Diaphragmatic hernia without obstruction or gangrene: Secondary | ICD-10-CM | POA: Diagnosis not present

## 2019-04-07 DIAGNOSIS — K5710 Diverticulosis of small intestine without perforation or abscess without bleeding: Secondary | ICD-10-CM | POA: Diagnosis not present

## 2019-04-07 DIAGNOSIS — K922 Gastrointestinal hemorrhage, unspecified: Secondary | ICD-10-CM | POA: Diagnosis not present

## 2019-04-07 MED ORDER — SODIUM CHLORIDE 0.9 % IV SOLN: 500.0000 mL | INTRAVENOUS | Status: DC

## 2019-04-07 NOTE — Op Note (Addendum)
[facility] Patient Name: Gerald [name] Procedure Date: 04/07/2019 10:00 AM MRN: [id number] Endoscopist: Napoleon Form , MD Age: 86 Referring MD:  Date of Birth: March 02, 1925 Gender: Male Account #: [id number] Procedure:                Upper GI endoscopy Indications:              Recent gastrointestinal bleeding, Assessment of                            upper GI pathologic condition for potential impact                            on the management of this patient's other diseases,                            Assessment of patient's health related to previous                            GI bleeding for potential impact on long-term                            anticoagulation therapy, Assessment of patient's                            health related to previous ulcer for potential                            impact on long-term anticoagulation therapy Medicines:                Monitored Anesthesia Care Procedure:                Pre-Anesthesia Assessment:                           - Prior to the procedure, a History and Physical                            was performed, and patient medications and                            allergies were reviewed. The patient's tolerance of                            previous anesthesia was also reviewed. The risks                            and benefits of the procedure and the sedation                            options and risks were discussed with the patient.                            All questions were answered, and informed consent  was obtained. Prior Anticoagulants: The patient                            last took Eliquis (apixaban) 2 days prior to the                            procedure. ASA Grade Assessment: III - A patient                            with severe systemic disease. After reviewing the                            risks and benefits, the patient was deemed in                             satisfactory condition to undergo the procedure.                           After obtaining informed consent, the endoscope was                            passed under direct vision. Throughout the                            procedure, the patient's blood pressure, pulse, and                            oxygen saturations were monitored continuously. The                            Endoscope was introduced through the mouth, and                            advanced to the second part of duodenum. The upper                            GI endoscopy was accomplished without difficulty.                            The patient tolerated the procedure well. Scope In: Scope Out: Findings:                 A large hiatal hernia was present.                           No gross lesions were noted in the entire esophagus.                           Patchy mildly erythematous mucosa without bleeding                            was found in the cardia.  A large non-bleeding diverticulum with retained                            food bezoar was found in the first portion of the                            duodenum.                           The second portion of the duodenum was normal. Complications:            No immediate complications. Estimated Blood Loss:     Estimated blood loss: none. Impression:               - Large hiatal hernia.                           - No gross lesions in esophagus.                           - Erythematous mucosa in the cardia.                           - Non-bleeding duodenal diverticulum.                           - Normal second portion of the duodenum.                           - No specimens collected. Recommendation:           - Resume previous diet.                           - Continue present medications.                           - Return to GI office PRN.                           - Resume Eliquis (apixaban) at prior dose tomorrow.                             Refer to managing physician for further adjustment                            of therapy. Mauri Pole, MD 04/07/2019 10:21:11 AM This report has been signed electronically.

## 2019-04-07 NOTE — Progress Notes (Signed)
pt tolerated well. VSS. awake and to recovery. Report given to RN.  

## 2019-04-07 NOTE — Patient Instructions (Signed)
Please read handouts provided. Continue present medications. Return to GI office as needed. Resume Eliquis ( apixaban ) at prior dose tomorrow.     YOU HAD AN ENDOSCOPIC PROCEDURE TODAY AT THE [facility]:   Refer to the procedure report that was given to you for any specific questions about what was found during the examination.  If the procedure report does not answer your questions, please call your gastroenterologist to clarify.  If you requested that your care partner not be given the details of your procedure findings, then the procedure report has been included in a sealed envelope for you to review at your convenience later.  YOU SHOULD EXPECT: Some feelings of bloating in the abdomen. Passage of more gas than usual.  Walking can help get rid of the air that was put into your GI tract during the procedure and reduce the bloating. If you had a lower endoscopy (such as a colonoscopy or flexible sigmoidoscopy) you may notice spotting of blood in your stool or on the toilet paper. If you underwent a bowel prep for your procedure, you may not have a normal bowel movement for a few days.  Please Note:  You might notice some irritation and congestion in your nose or some drainage.  This is from the oxygen used during your procedure.  There is no need for concern and it should clear up in a day or so.  SYMPTOMS TO REPORT IMMEDIATELY:   Following upper endoscopy (EGD)  Vomiting of blood or coffee ground material  New chest pain or pain under the shoulder blades  Painful or persistently difficult swallowing  New shortness of breath  Fever of 100F or higher  Black, tarry-looking stools  For urgent or emergent issues, a gastroenterologist can be reached at any hour by calling [phone]. Do not use MyChart messaging for urgent concerns.    DIET:  We do recommend a small meal at first, but then you may proceed to your regular diet.  Drink plenty of fluids but you should  avoid alcoholic beverages for 24 hours.  ACTIVITY:  You should plan to take it easy for the rest of today and you should NOT DRIVE or use heavy machinery until tomorrow (because of the sedation medicines used during the test).    FOLLOW UP: Our staff will call the number listed on your records 48-72 hours following your procedure to check on you and address any questions or concerns that you may have regarding the information given to you following your procedure. If we do not reach you, we will leave a message.  We will attempt to reach you two times.  During this call, we will ask if you have developed any symptoms of COVID 19. If you develop any symptoms (ie: fever, flu-like symptoms, shortness of breath, cough etc.) before then, please call [phone].  If you test positive for Covid 19 in the 2 weeks post procedure, please call and report this information to [place].    If any biopsies were taken you will be contacted by phone or by letter within the next 1-3 weeks.  Please call us at [phone] if you have not heard about the biopsies in 3 weeks.    SIGNATURES/CONFIDENTIALITY: You and/or your care partner have signed paperwork which will be entered into your electronic medical record.  These signatures attest to the fact that that the information above on your After Visit Summary has been reviewed and is understood.  Full responsibility of  the confidentiality of this discharge information lies with you and/or your care-partner. 

## 2019-04-07 NOTE — Progress Notes (Signed)
Temp LC V/s CW 

## 2019-04-11 ENCOUNTER — Telehealth: Payer: Self-pay

## 2019-04-11 ENCOUNTER — Telehealth: Payer: Self-pay | Admitting: *Deleted

## 2019-04-11 NOTE — Telephone Encounter (Signed)
Left message on follow up call. 

## 2019-04-11 NOTE — Telephone Encounter (Signed)
No answer for post procedure call back. Left message for patients granddaughter to call with questions or concerns.

## 2020-08-29 IMAGING — CT CT ABD-PELV W/O CM
2 of 4 series · 15 of 46 positions shown, 17 images · non-contrast
Comparison: None.

CLINICAL DATA: Diarrhea since yesterday evening. Complains of body
aches in loss of appetite

EXAM:
CT ABDOMEN AND PELVIS WITHOUT CONTRAST
TECHNIQUE: Multidetector CT imaging of the abdomen and pelvis was performed
following the standard protocol without IV contrast.

[Series 2: axial st · axial · 0.83mm/px · z∈[-631,-161]mm · 12 of 106 slices shown, 14 images]
[im 6/106  soft-tissue]
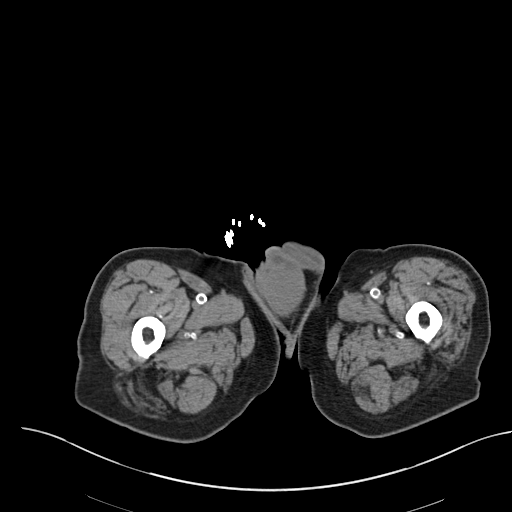
[im 6/106  bone]
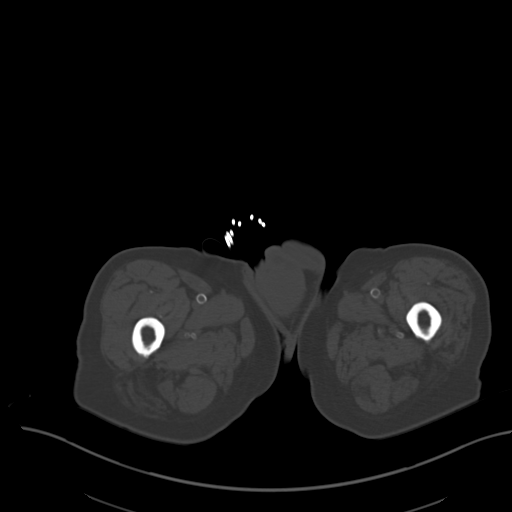
[im 18/106  soft-tissue]
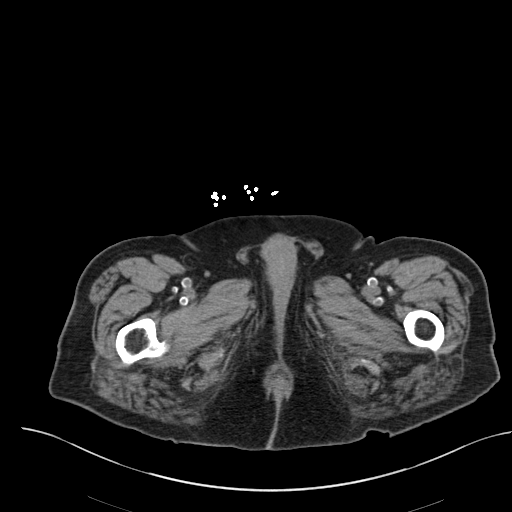
[im 24/106  soft-tissue]
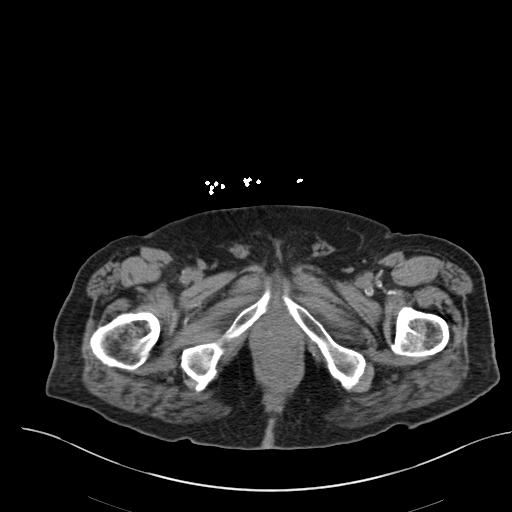
[im 30/106  soft-tissue]
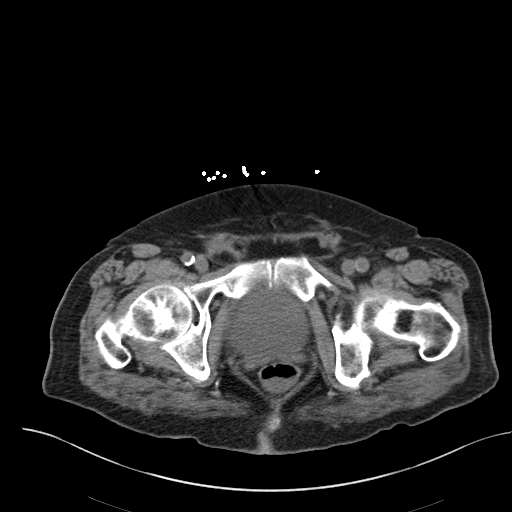
[im 41/106  soft-tissue]
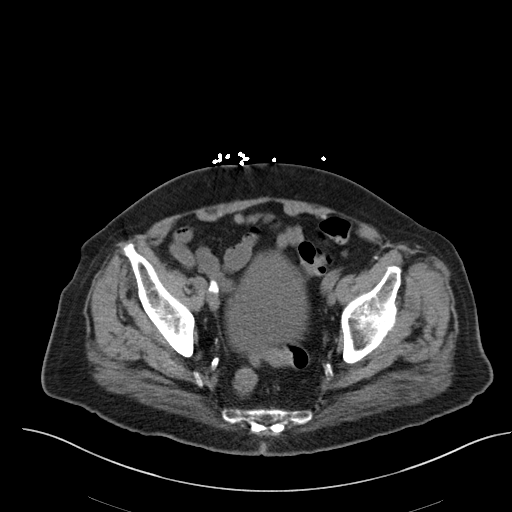
[im 47/106  soft-tissue]
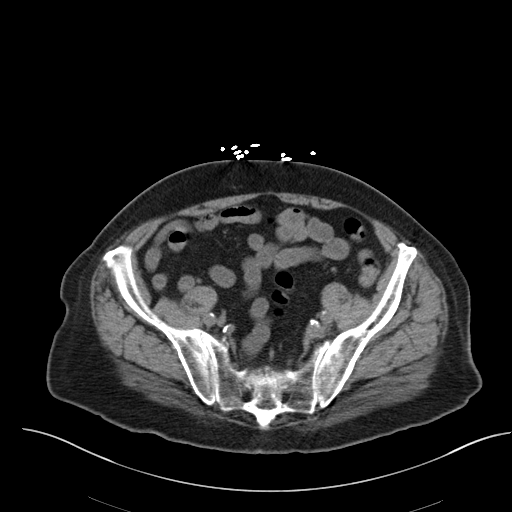
[im 59/106  soft-tissue]
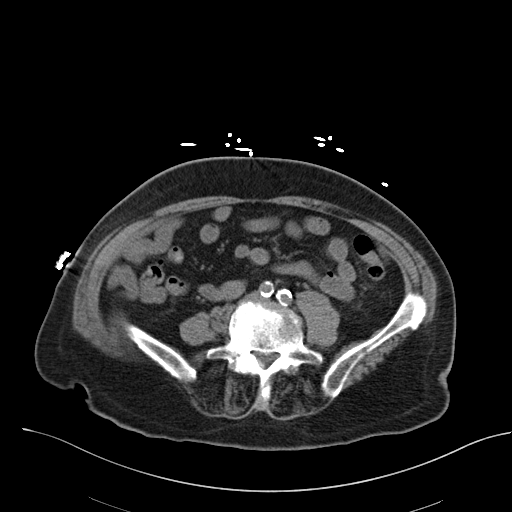
[im 65/106  soft-tissue]
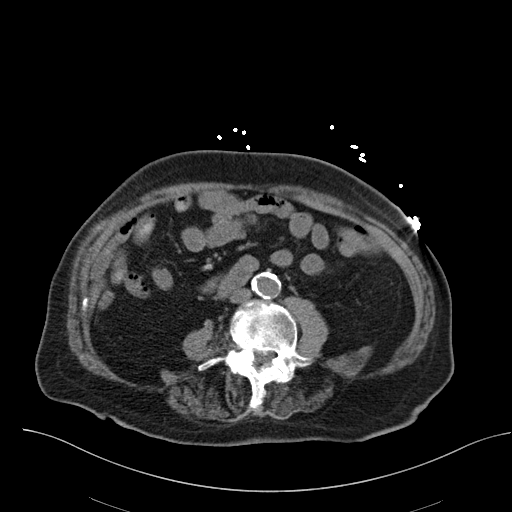
[im 76/106  soft-tissue]
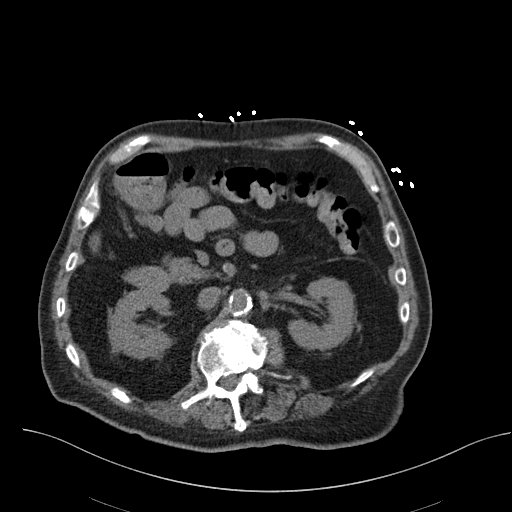
[im 76/106  bone]
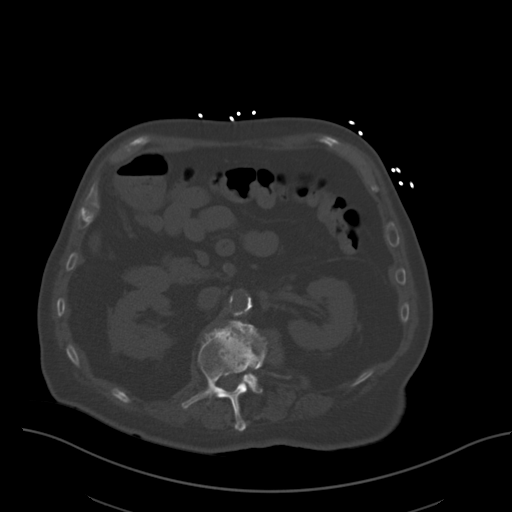
[im 82/106  soft-tissue]
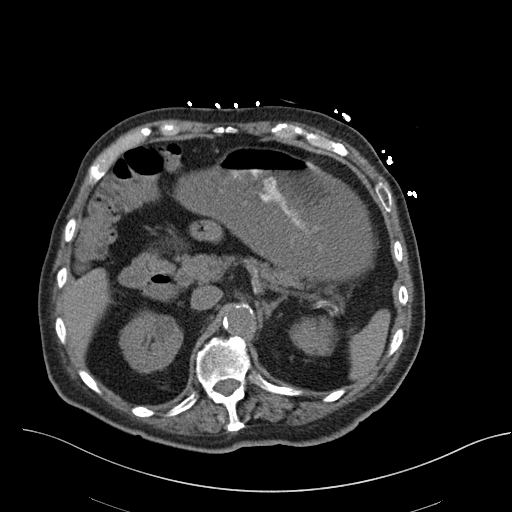
[im 88/106  soft-tissue]
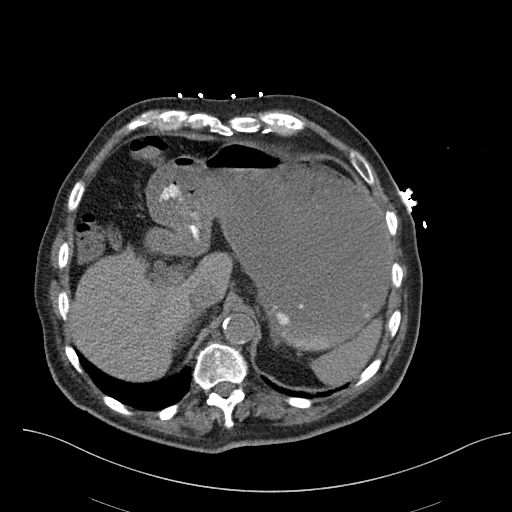
[im 100/106  soft-tissue]
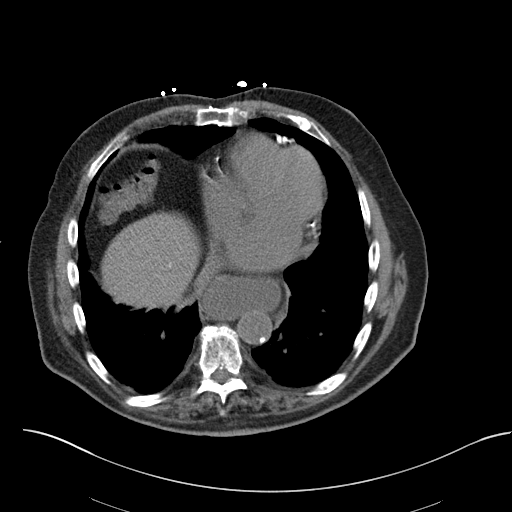

[Series 5: coronal st · coronal · 0.84mm/px · 3 of 150 slices shown]
[im 50/150  soft-tissue]
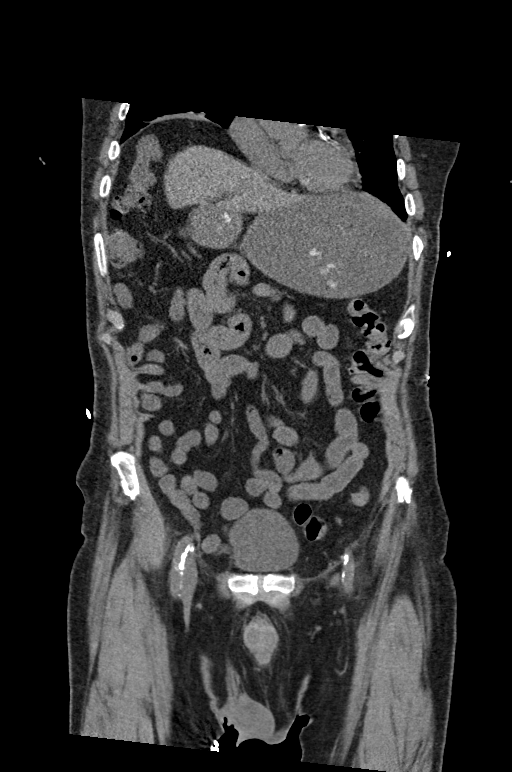
[im 67/150  soft-tissue]
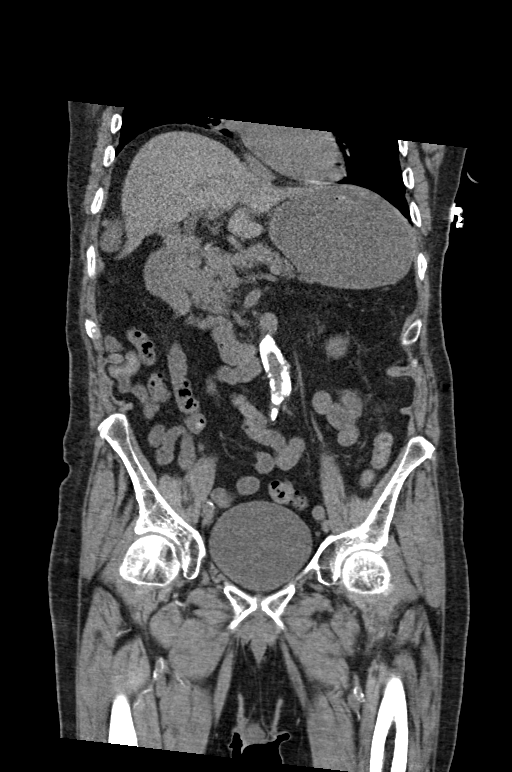
[im 83/150  soft-tissue]
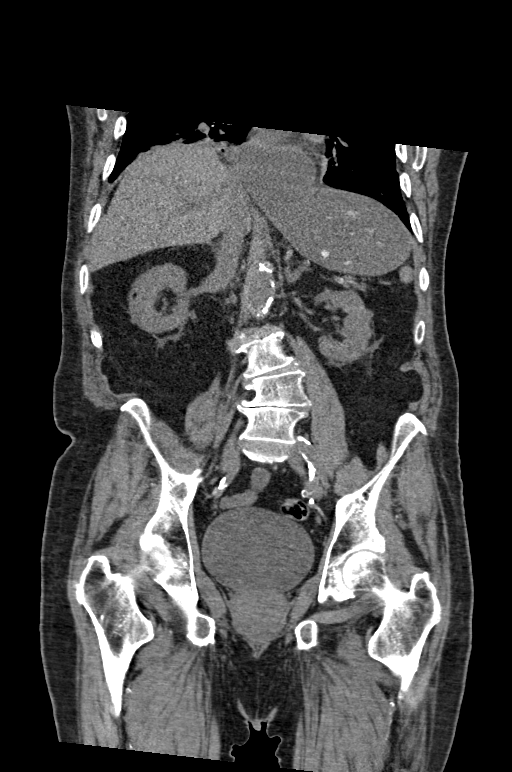

[15 of 46 positions shown; findings below may reference images not displayed]

FINDINGS: Lower chest: No acute abnormality.

Hepatobiliary: No focal liver abnormality is seen. No gallstones,
gallbladder wall thickening, or biliary dilatation.

Pancreas: Unremarkable. No pancreatic ductal dilatation or
surrounding inflammatory changes.

Spleen: Normal in size without focal abnormality.

Adrenals/Urinary Tract: Normal adrenal glands. Cystic structure
within upper pole of the right kidney measures 1.9 cm. This is
incompletely characterized without IV contrast. No hydronephrosis or
mass identified bilaterally. Urinary bladder appears normal.

Stomach/Bowel: Hiatal hernia. Moderate distension of the gastric
lumen. No abnormal bowel wall thickening, inflammation or distension
identified. The appendix is not confidently identified. No secondary
signs however of acute appendicitis. A few scattered colonic
diverticula noted without acute inflammation.

Vascular/Lymphatic: Aortic atherosclerosis. No aneurysm. No
abdominopelvic adenopathy identified.

Reproductive: Prostate is unremarkable.

Other: No abdominal wall hernia or abnormality. No abdominopelvic
ascites.

Musculoskeletal: Thoracolumbar scoliosis and multi level
degenerative disc disease identified. No acute or suspicious osseous
findings identified.
IMPRESSION: 1. No acute findings within the abdomen or pelvis.
2. Hiatal hernia.
3. Aortic atherosclerosis.

The

Aortic Atherosclerosis ([id number]).

## 2020-08-30 IMAGING — US US RENAL
1 series · 14 of 25 positions shown · non-contrast
Comparison: None.

CLINICAL DATA: Acute kidney injury

EXAM:
RENAL / URINARY TRACT ULTRASOUND COMPLETE

[Series 1: us renal · 14 of 40 slices shown]
[im 1/40]
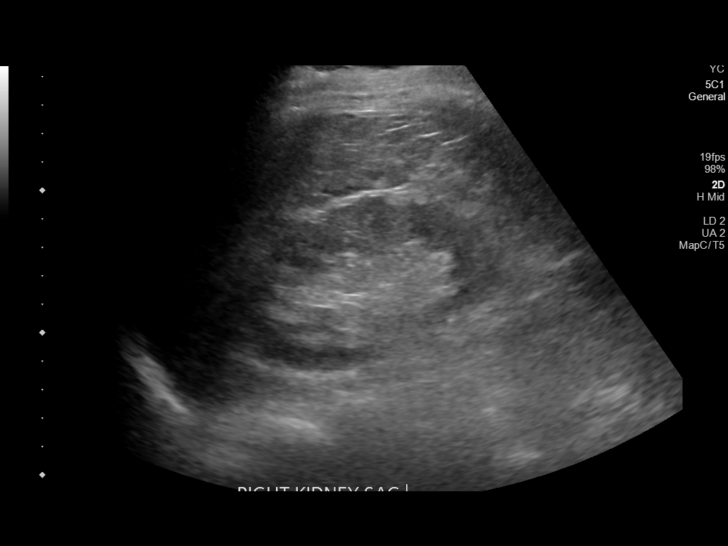
[im 4/40]
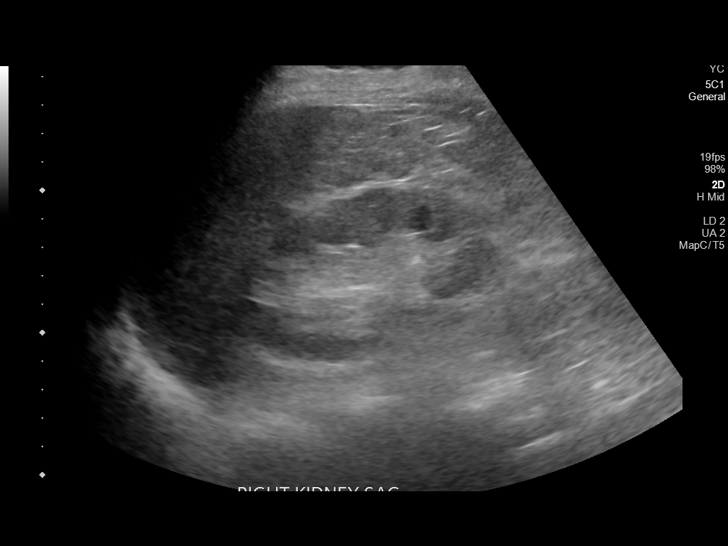
[im 7/40]
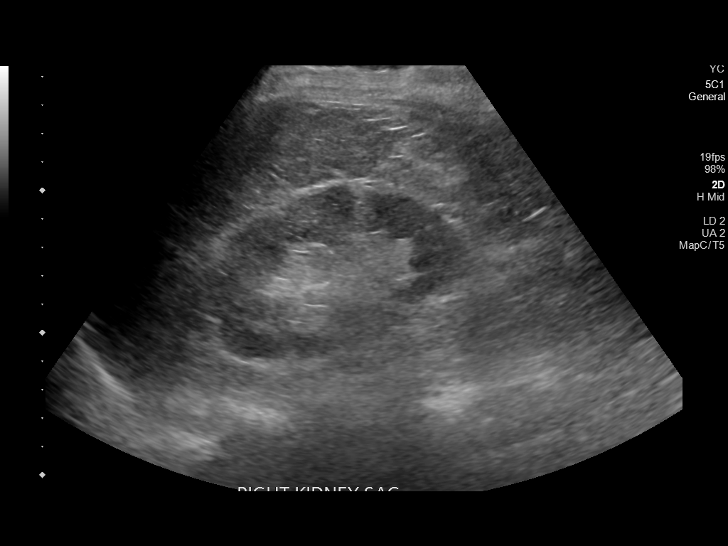
[im 10/40]
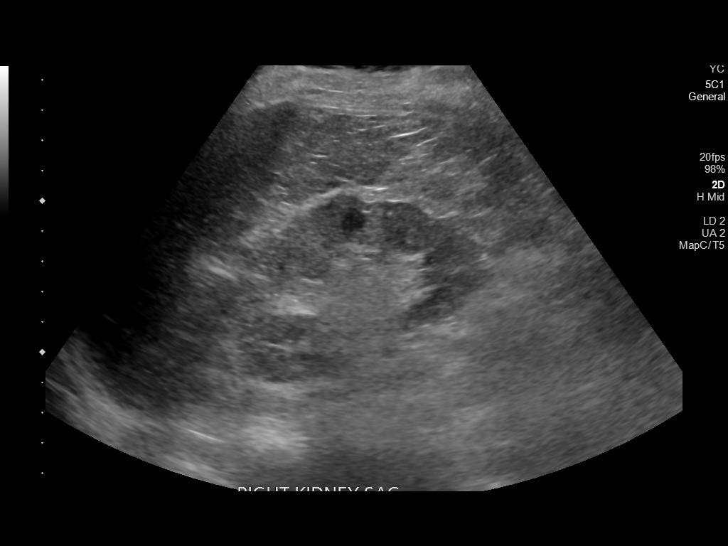
[im 14/40]
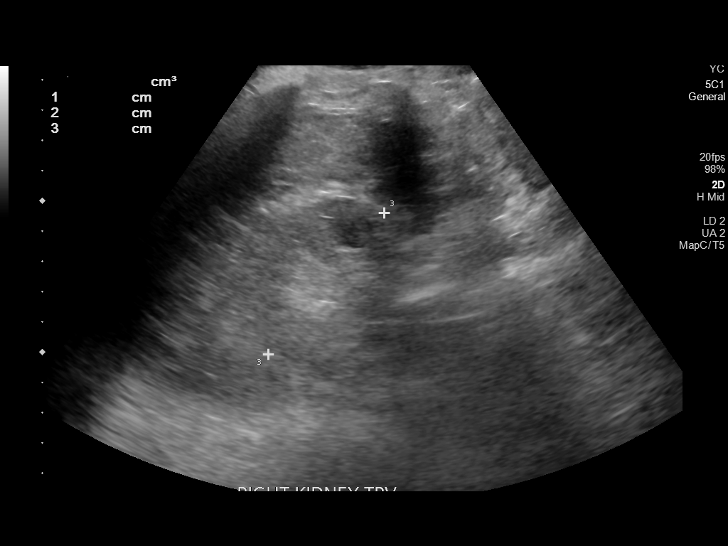
[im 15/40]
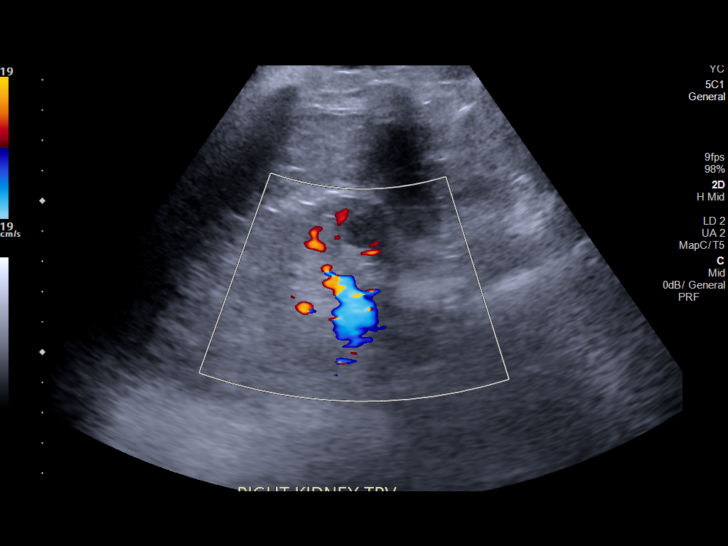
[im 18/40]
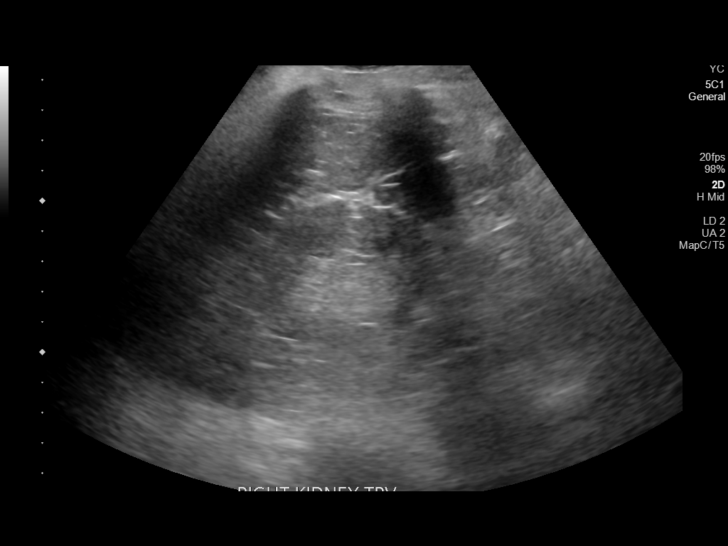
[im 22/40]
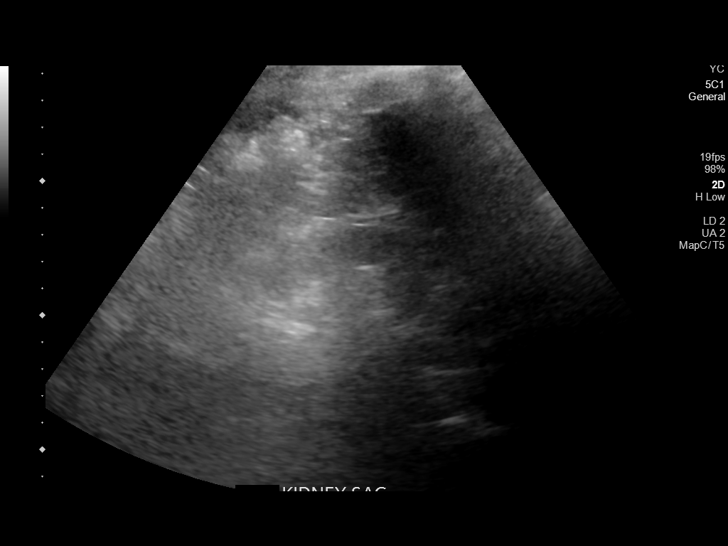
[im 25/40]
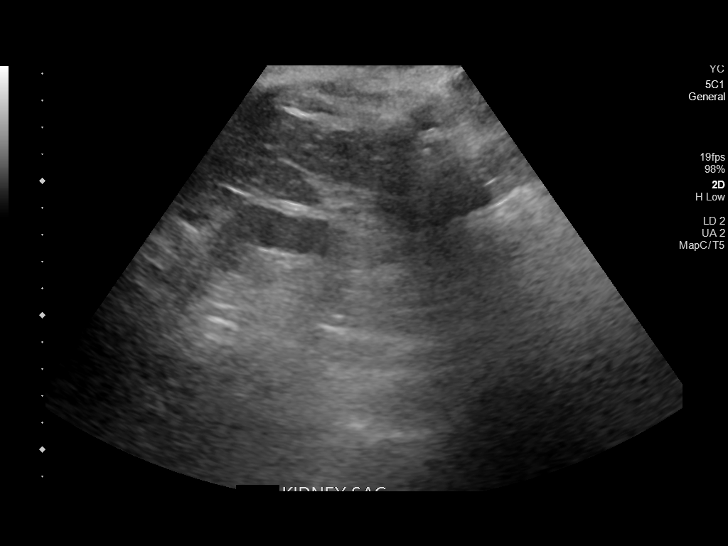
[im 27/40]
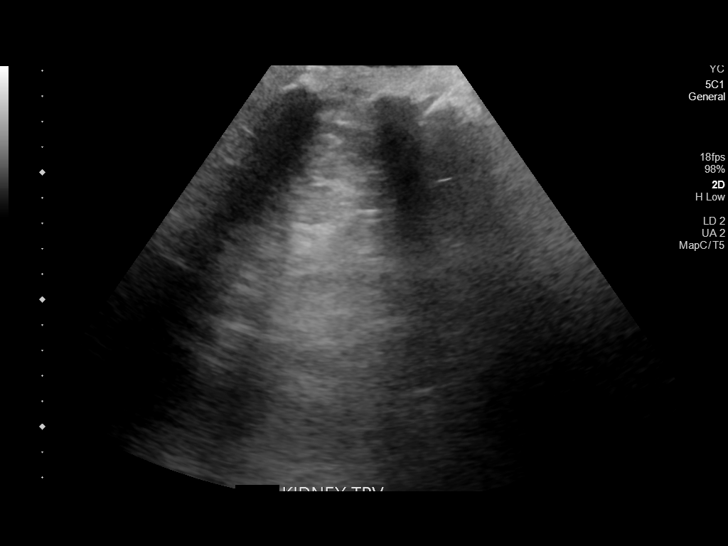
[im 30/40]
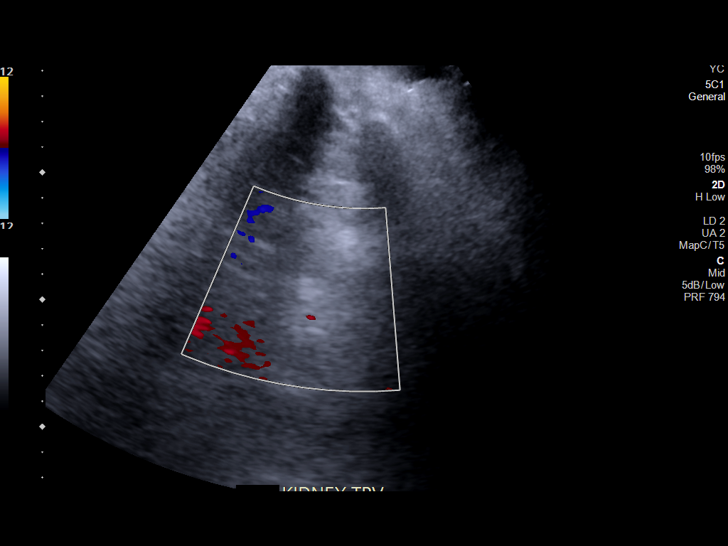
[im 33/40]
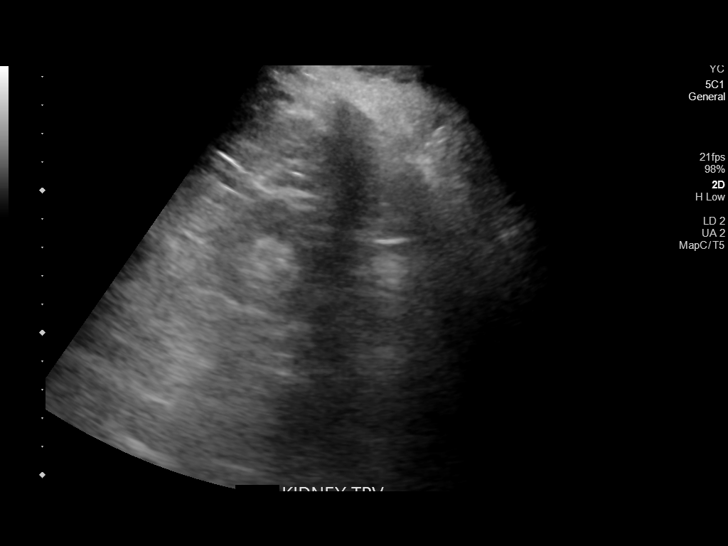
[im 36/40]
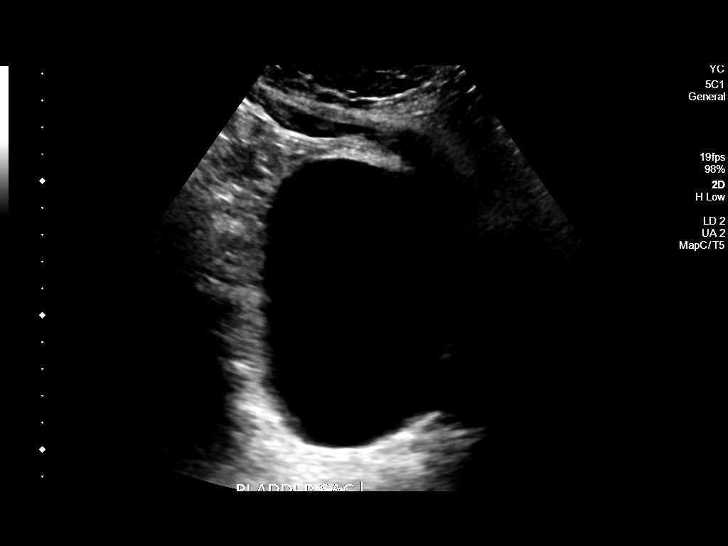
[im 40/40]
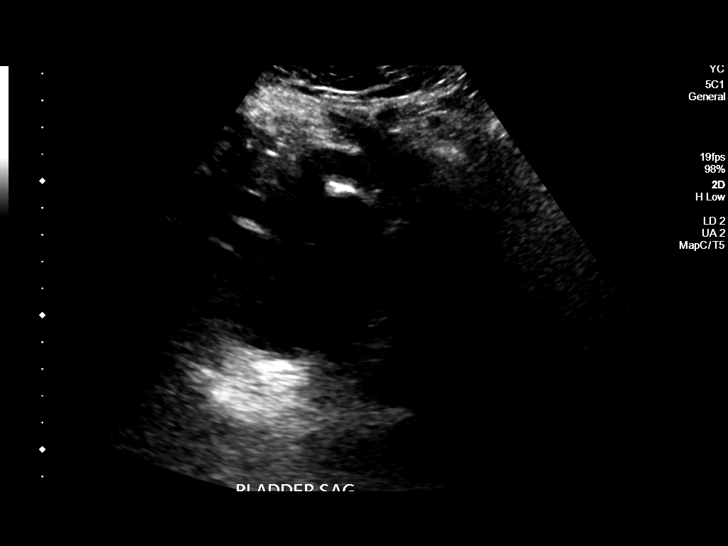

[14 of 25 positions shown; findings below may reference images not displayed]

FINDINGS: Right Kidney:

Renal measurements: 9.7 x 5.5 x 6.0 cm = volume: 170 mL .
Echogenicity is mildly increased. No mass or hydronephrosis
visualized.

Left Kidney:

Renal measurements: 9.3 x 5.7 x 6.9 cm = volume: Is 189 mL.
Echogenicity within normal limits. No mass or hydronephrosis
visualized.

Bladder:

Appears normal for degree of bladder distention.

Other:

None.
IMPRESSION: No hydronephrosis. Mildly increased echogenicity of the right
kidney, possibly due to chronic medical renal disease.

## 2022-11-06 ENCOUNTER — Emergency Department (HOSPITAL_COMMUNITY): Payer: Medicare Other

## 2022-11-06 ENCOUNTER — Inpatient Hospital Stay (HOSPITAL_COMMUNITY)
Admission: EM | Admit: 2022-11-06 | Discharge: 2022-11-09 | DRG: 638 | Disposition: A | Discharge status: Skilled Nursing Facility | Payer: Medicare Other | Source: Skilled Nursing Facility | Attending: Internal Medicine | Admitting: Internal Medicine

## 2022-11-06 ENCOUNTER — Other Ambulatory Visit: Payer: Self-pay

## 2022-11-06 ENCOUNTER — Inpatient Hospital Stay (HOSPITAL_COMMUNITY): Payer: Medicare Other

## 2022-11-06 DIAGNOSIS — H9193 Unspecified hearing loss, bilateral: Secondary | ICD-10-CM | POA: Diagnosis present

## 2022-11-06 DIAGNOSIS — Z79890 Hormone replacement therapy (postmenopausal): Secondary | ICD-10-CM | POA: Diagnosis not present

## 2022-11-06 DIAGNOSIS — Z8249 Family history of ischemic heart disease and other diseases of the circulatory system: Secondary | ICD-10-CM | POA: Diagnosis not present

## 2022-11-06 DIAGNOSIS — M868X7 Other osteomyelitis, ankle and foot: Secondary | ICD-10-CM | POA: Diagnosis present

## 2022-11-06 DIAGNOSIS — E11621 Type 2 diabetes mellitus with foot ulcer: Secondary | ICD-10-CM | POA: Diagnosis present

## 2022-11-06 DIAGNOSIS — L02612 Cutaneous abscess of left foot: Secondary | ICD-10-CM | POA: Diagnosis present

## 2022-11-06 DIAGNOSIS — B351 Tinea unguium: Secondary | ICD-10-CM | POA: Diagnosis present

## 2022-11-06 DIAGNOSIS — Z66 Do not resuscitate: Secondary | ICD-10-CM | POA: Diagnosis present

## 2022-11-06 DIAGNOSIS — E118 Type 2 diabetes mellitus with unspecified complications: Secondary | ICD-10-CM

## 2022-11-06 DIAGNOSIS — E11319 Type 2 diabetes mellitus with unspecified diabetic retinopathy without macular edema: Secondary | ICD-10-CM | POA: Diagnosis present

## 2022-11-06 DIAGNOSIS — H3530 Unspecified macular degeneration: Secondary | ICD-10-CM | POA: Diagnosis present

## 2022-11-06 DIAGNOSIS — E119 Type 2 diabetes mellitus without complications: Secondary | ICD-10-CM

## 2022-11-06 DIAGNOSIS — K219 Gastro-esophageal reflux disease without esophagitis: Secondary | ICD-10-CM | POA: Diagnosis present

## 2022-11-06 DIAGNOSIS — Z87891 Personal history of nicotine dependence: Secondary | ICD-10-CM | POA: Diagnosis not present

## 2022-11-06 DIAGNOSIS — I10 Essential (primary) hypertension: Secondary | ICD-10-CM | POA: Diagnosis present

## 2022-11-06 DIAGNOSIS — Z823 Family history of stroke: Secondary | ICD-10-CM

## 2022-11-06 DIAGNOSIS — L03116 Cellulitis of left lower limb: Secondary | ICD-10-CM | POA: Diagnosis present

## 2022-11-06 DIAGNOSIS — I2510 Atherosclerotic heart disease of native coronary artery without angina pectoris: Secondary | ICD-10-CM | POA: Diagnosis present

## 2022-11-06 DIAGNOSIS — E039 Hypothyroidism, unspecified: Secondary | ICD-10-CM | POA: Diagnosis present

## 2022-11-06 DIAGNOSIS — M7989 Other specified soft tissue disorders: Secondary | ICD-10-CM | POA: Diagnosis present

## 2022-11-06 DIAGNOSIS — E782 Mixed hyperlipidemia: Secondary | ICD-10-CM | POA: Diagnosis present

## 2022-11-06 DIAGNOSIS — E038 Other specified hypothyroidism: Secondary | ICD-10-CM | POA: Diagnosis not present

## 2022-11-06 DIAGNOSIS — Z800 Family history of malignant neoplasm of digestive organs: Secondary | ICD-10-CM

## 2022-11-06 DIAGNOSIS — I480 Paroxysmal atrial fibrillation: Secondary | ICD-10-CM | POA: Diagnosis present

## 2022-11-06 DIAGNOSIS — L03032 Cellulitis of left toe: Secondary | ICD-10-CM | POA: Diagnosis present

## 2022-11-06 DIAGNOSIS — Z8261 Family history of arthritis: Secondary | ICD-10-CM

## 2022-11-06 DIAGNOSIS — H903 Sensorineural hearing loss, bilateral: Secondary | ICD-10-CM | POA: Diagnosis not present

## 2022-11-06 DIAGNOSIS — E11628 Type 2 diabetes mellitus with other skin complications: Secondary | ICD-10-CM | POA: Diagnosis present

## 2022-11-06 DIAGNOSIS — E1140 Type 2 diabetes mellitus with diabetic neuropathy, unspecified: Secondary | ICD-10-CM | POA: Diagnosis present

## 2022-11-06 DIAGNOSIS — E1169 Type 2 diabetes mellitus with other specified complication: Secondary | ICD-10-CM | POA: Diagnosis present

## 2022-11-06 DIAGNOSIS — M869 Osteomyelitis, unspecified: Secondary | ICD-10-CM | POA: Diagnosis not present

## 2022-11-06 DIAGNOSIS — E785 Hyperlipidemia, unspecified: Secondary | ICD-10-CM | POA: Diagnosis present

## 2022-11-06 DIAGNOSIS — Z7901 Long term (current) use of anticoagulants: Secondary | ICD-10-CM | POA: Diagnosis not present

## 2022-11-06 DIAGNOSIS — L304 Erythema intertrigo: Secondary | ICD-10-CM | POA: Diagnosis present

## 2022-11-06 DIAGNOSIS — L97529 Non-pressure chronic ulcer of other part of left foot with unspecified severity: Secondary | ICD-10-CM | POA: Diagnosis present

## 2022-11-06 DIAGNOSIS — Z951 Presence of aortocoronary bypass graft: Secondary | ICD-10-CM

## 2022-11-06 DIAGNOSIS — Z794 Long term (current) use of insulin: Secondary | ICD-10-CM | POA: Diagnosis not present

## 2022-11-06 DIAGNOSIS — L089 Local infection of the skin and subcutaneous tissue, unspecified: Secondary | ICD-10-CM | POA: Diagnosis not present

## 2022-11-06 DIAGNOSIS — Z833 Family history of diabetes mellitus: Secondary | ICD-10-CM

## 2022-11-06 DIAGNOSIS — Z79899 Other long term (current) drug therapy: Secondary | ICD-10-CM

## 2022-11-06 LAB — CBC WITH DIFFERENTIAL/PLATELET
Abs Immature Granulocytes: 0.03 10*3/uL (ref 0.00–0.07)
Basophils Absolute: 0 10*3/uL (ref 0.0–0.1)
Basophils Relative: 0 %
Eosinophils Absolute: 0 10*3/uL (ref 0.0–0.5)
Eosinophils Relative: 1 %
HCT: 33.4 % — ABNORMAL LOW (ref 39.0–52.0)
Hemoglobin: 10.2 g/dL — ABNORMAL LOW (ref 13.0–17.0)
Immature Granulocytes: 1 %
Lymphocytes Relative: 19 %
Lymphs Abs: 1 10*3/uL (ref 0.7–4.0)
MCH: 27.1 pg (ref 26.0–34.0)
MCHC: 30.5 g/dL (ref 30.0–36.0)
MCV: 88.6 fL (ref 80.0–100.0)
Monocytes Absolute: 0.5 10*3/uL (ref 0.1–1.0)
Monocytes Relative: 10 %
Neutro Abs: 3.6 10*3/uL (ref 1.7–7.7)
Neutrophils Relative %: 69 %
Platelets: 223 10*3/uL (ref 150–400)
RBC: 3.77 MIL/uL — ABNORMAL LOW (ref 4.22–5.81)
RDW: 15.2 % (ref 11.5–15.5)
WBC: 5.2 10*3/uL (ref 4.0–10.5)
nRBC: 0 % (ref 0.0–0.2)

## 2022-11-06 LAB — COMPREHENSIVE METABOLIC PANEL
ALT: 13 U/L (ref 0–44)
AST: 15 U/L (ref 15–41)
Albumin: 3.4 g/dL — ABNORMAL LOW (ref 3.5–5.0)
Alkaline Phosphatase: 51 U/L (ref 38–126)
Anion gap: 9 (ref 5–15)
BUN: 35 mg/dL — ABNORMAL HIGH (ref 8–23)
CO2: 25 mmol/L (ref 22–32)
Calcium: 8.1 mg/dL — ABNORMAL LOW (ref 8.9–10.3)
Chloride: 103 mmol/L (ref 98–111)
Creatinine, Ser: 1.43 mg/dL — ABNORMAL HIGH (ref 0.61–1.24)
GFR, Estimated: 45 mL/min — ABNORMAL LOW (ref >60)
Glucose, Bld: 121 mg/dL — ABNORMAL HIGH (ref 70–99)
Potassium: 4.5 mmol/L (ref 3.5–5.1)
Sodium: 137 mmol/L (ref 135–145)
Total Bilirubin: 0.4 mg/dL (ref 0.3–1.2)
Total Protein: 7.1 g/dL (ref 6.5–8.1)

## 2022-11-06 LAB — I-STAT CG4 LACTIC ACID, ED: Lactic Acid, Venous: 0.7 mmol/L (ref 0.5–1.9)

## 2022-11-06 MED ORDER — ACETAMINOPHEN 650 MG RE SUPP: 650.0000 mg | Freq: Four times a day (QID) | RECTAL | Status: DC | PRN

## 2022-11-06 MED ORDER — INSULIN GLARGINE-YFGN 100 UNIT/ML ~~LOC~~ SOLN
10.0000 [IU] | Freq: Every day | SUBCUTANEOUS | Status: DC
  Administered 2022-11-07 – 2022-11-08 (×2): 10 [IU] via SUBCUTANEOUS
  Filled 2022-11-06 (×4): qty 0.1

## 2022-11-06 MED ORDER — INSULIN ASPART 100 UNIT/ML IJ SOLN: 0.0000 [IU] | Freq: Every day | INTRAMUSCULAR | Status: DC

## 2022-11-06 MED ORDER — PRAVASTATIN SODIUM 20 MG PO TABS
10.0000 mg | ORAL_TABLET | Freq: Every day | ORAL | Status: DC
  Administered 2022-11-07 – 2022-11-09 (×3): 10 mg via ORAL
  Filled 2022-11-06 (×3): qty 1

## 2022-11-06 MED ORDER — CEFAZOLIN SODIUM-DEXTROSE 1-4 GM/50ML-% IV SOLN
1.0000 g | Freq: Two times a day (BID) | INTRAVENOUS | Status: DC
  Administered 2022-11-07: 1 g via INTRAVENOUS
  Filled 2022-11-06: qty 50

## 2022-11-06 MED ORDER — PANTOPRAZOLE SODIUM 40 MG PO TBEC
40.0000 mg | DELAYED_RELEASE_TABLET | Freq: Two times a day (BID) | ORAL | Status: DC
  Administered 2022-11-07 – 2022-11-09 (×5): 40 mg via ORAL
  Filled 2022-11-06 (×5): qty 1

## 2022-11-06 MED ORDER — METOPROLOL SUCCINATE ER 25 MG PO TB24
25.0000 mg | ORAL_TABLET | Freq: Every day | ORAL | Status: DC
  Administered 2022-11-07 – 2022-11-09 (×3): 25 mg via ORAL
  Filled 2022-11-06 (×3): qty 1

## 2022-11-06 MED ORDER — ONDANSETRON HCL 4 MG/2ML IJ SOLN: 4.0000 mg | Freq: Four times a day (QID) | INTRAMUSCULAR | Status: DC | PRN

## 2022-11-06 MED ORDER — INSULIN ASPART 100 UNIT/ML IJ SOLN
0.0000 [IU] | Freq: Three times a day (TID) | INTRAMUSCULAR | Status: DC
  Administered 2022-11-09: 2 [IU] via SUBCUTANEOUS

## 2022-11-06 MED ORDER — LEVOTHYROXINE SODIUM 50 MCG PO TABS
50.0000 ug | ORAL_TABLET | Freq: Every day | ORAL | Status: DC
  Administered 2022-11-07 – 2022-11-09 (×3): 50 ug via ORAL
  Filled 2022-11-06 (×3): qty 1

## 2022-11-06 MED ORDER — ONDANSETRON HCL 4 MG PO TABS: 4.0000 mg | ORAL_TABLET | Freq: Four times a day (QID) | ORAL | Status: DC | PRN

## 2022-11-06 MED ORDER — SODIUM CHLORIDE 0.9 % IV SOLN
1.0000 g | Freq: Once | INTRAVENOUS | Status: AC
  Administered 2022-11-06: 1 g via INTRAVENOUS
  Filled 2022-11-06: qty 10

## 2022-11-06 MED ORDER — SODIUM CHLORIDE 0.45 % IV SOLN: INTRAVENOUS | Status: DC

## 2022-11-06 MED ORDER — SERTRALINE HCL 50 MG PO TABS
50.0000 mg | ORAL_TABLET | Freq: Every day | ORAL | Status: DC
  Administered 2022-11-07 – 2022-11-09 (×3): 50 mg via ORAL
  Filled 2022-11-06 (×3): qty 1

## 2022-11-06 MED ORDER — ENOXAPARIN SODIUM 30 MG/0.3ML IJ SOSY
30.0000 mg | PREFILLED_SYRINGE | INTRAMUSCULAR | Status: DC
  Administered 2022-11-07: 30 mg via SUBCUTANEOUS
  Filled 2022-11-06: qty 0.3

## 2022-11-06 MED ORDER — ACETAMINOPHEN 325 MG PO TABS: 650.0000 mg | ORAL_TABLET | Freq: Four times a day (QID) | ORAL | Status: DC | PRN

## 2022-11-06 NOTE — ED Provider Notes (Signed)
I provided a substantive portion of the care of this patient.  I personally made/approved the management plan for this patient and take responsibility for the patient management.     Patient sent from [facility] for toe infection.  Patient does have history of diabetes.  They have been unable to do foot nail care.  Patient has nail that has grown and put intense pressure on the second digit creating a deep ulcer.  He now has redness and swelling.  Patient has a focal deep ulcer with some purulent or exudative material in it on the second toe.  He has erythema of the forefoot.  At this time with diabetes and a deep ulceration on the toe which may have associated osteomyelitis, although the x-ray is not currently showing this, and significant forefoot infection and diabetic, plan will be for admission and IV antibiotics.   Arby Barrette, MD 11/06/22 2010

## 2022-11-06 NOTE — ED Provider Notes (Signed)
[place] EMERGENCY DEPARTMENT AT [facility] Provider Note   CSN: [id number] Arrival date & time: 11/06/22  1814     History  Chief Complaint  Patient presents with   Toe Pain    Gerald [name] is a 89 y.o. male.  89 year old male brought in by EMS from [facility] nursing facility for left foot toe infection secondary to overgrown toe nails that patient has refused to have trimmed. No fevers, area is painful, patient is ambulatory with pain.  History of diabetes, CAD, HTN, a fib.        Home Medications Prior to Admission medications   Medication Sig Start Date End Date Taking? Authorizing Provider  Blood Glucose Monitoring Suppl (ONE TOUCH ULTRA 2) W/DEVICE KIT Test blood sugar daily. Dx Code: 250.72 01/18/13   Pecola Lawless, MD  diltiazem (CARDIZEM CD) 120 MG 24 hr capsule Take 1 capsule (120 mg total) by mouth daily. 12/31/18   Burnadette Pop, MD  ELIQUIS 2.5 MG TABS tablet Take 1 tablet (2.5 mg total) by mouth 2 (two) times daily. Start on 12/31/18 12/30/18   Burnadette Pop, MD  Insulin Pen Needle (B-D UF III MINI PEN NEEDLES) 31G X 5 MM MISC USE TO TEST BLOOD SUGARS ICD 10 E11 59 03/13/14   Pecola Lawless, MD  LANTUS 100 UNIT/ML injection Inject 10 Units into the skin daily. 12/02/18   [provider]  levothyroxine (SYNTHROID) 25 MCG tablet Take 25 mcg by mouth every morning. 12/21/18   [provider]  metoprolol succinate (TOPROL-XL) 25 MG 24 hr tablet Take 25 mg by mouth daily. 12/14/18   [provider]  ONE TOUCH ULTRA TEST test strip CHECK BLOOD GLUCOSE (SUGAR) DAILY 02/15/14   Pecola Lawless, MD  [facility] DELICA LANCETS 33G MISC CHECK BLOOD GLUCOSE DAILY E11  59 02/24/14   Pecola Lawless, MD  pantoprazole (PROTONIX) 40 MG tablet Take 1 tablet (40 mg total) by mouth 2 (two) times daily. 12/30/18   Burnadette Pop, MD  pravastatin (PRAVACHOL) 10 MG tablet Take 10 mg by mouth daily. 12/23/18   [provider]  sertraline (ZOLOFT) 50 MG tablet Take 50 mg by mouth daily. 12/21/18   [provider]      Allergies    Patient has no known allergies.    Review of Systems   Review of Systems Level 5 caveat for [facility], does not contribute to history  Physical Exam Updated Vital Signs BP 129/68 (BP Location: Left Arm)   Pulse 63   Temp 98.1 F (36.7 C) (Oral)   Resp 18   SpO2 97%  Physical Exam Vitals and nursing note reviewed.  Constitutional:      Appearance: Normal appearance. He is not ill-appearing or toxic-appearing.  HENT:     Head: Normocephalic and atraumatic.  Cardiovascular:     Pulses: Normal pulses.  Pulmonary:     Effort: Pulmonary effort is normal.  Musculoskeletal:        General: Swelling and tenderness present.  Feet:     Comments: Ulcer to dorsal surface of left 2nd toe, erythema and swelling to the left foot, toes are tender to the touch Skin:    General: Skin is warm and dry.     Findings: Erythema present.  Neurological:     General: No focal deficit present.     Mental Status: He is alert.  Psychiatric:        Behavior: Behavior normal.  ED Results / Procedures / Treatments   Labs (all labs ordered are listed, but only abnormal results are displayed) Labs Reviewed  CBC WITH DIFFERENTIAL/PLATELET - Abnormal; Notable for the following components:      Result Value   RBC 3.77 (*)    Hemoglobin 10.2 (*)    HCT 33.4 (*)    All other components within normal limits  COMPREHENSIVE METABOLIC PANEL - Abnormal; Notable for the following components:   Glucose, Bld 121 (*)    BUN 35 (*)    Creatinine, Ser 1.43 (*)    Calcium 8.1 (*)    Albumin 3.4 (*)    GFR, Estimated 45 (*)    All other components within normal limits  CULTURE, BLOOD (ROUTINE X 2)  CULTURE, BLOOD (ROUTINE X 2)  I-STAT CG4 LACTIC ACID, ED    EKG None  Radiology DG Foot Complete Left  Result Date: 11/06/2022 CLINICAL DATA:  2nd toe ulcer. EXAM: LEFT FOOT -  COMPLETE 3+ VIEW COMPARISON:  None Available. FINDINGS: There is soft tissue ulceration of the distal aspect of the second toe. There is no foreign body or cortical erosion. No acute fracture or dislocation. Peripheral vascular calcifications are present. IMPRESSION: Soft tissue ulceration of the distal aspect of the second toe. No radiographic evidence of osteomyelitis. Electronically Signed   By: Darliss Cheney M.D.   On: 11/06/2022 19:48    Procedures Procedures    Medications Ordered in ED Medications  cefTRIAXone (ROCEPHIN) 1 g in sodium chloride 0.9 % 100 mL IVPB (1 g Intravenous New Bag/Given 11/06/22 2022)    ED Course/ Medical Decision Making/ A&P                                 Medical Decision Making  This patient presents to the ED for concern of toe infection/wound, this involves an extensive number of treatment options, and is a complaint that carries with it a high risk of complications and morbidity.  The differential diagnosis includes but not limited to osteomyelitis, cellulitis.   Co morbidities that complicate the patient evaluation  Afib (on eliquis per records), HTN, DM, macular degeneration, HOH   Additional history obtained:  Additional history obtained from EMS who provides history above External records from outside source obtained and reviewed including med list on file   Lab Tests:  I Ordered, and personally interpreted labs.  The pertinent results include: Lactic acid unremarkable at 0.7.  CMP with elevated creatinine at 1.43, glucose 121, nonfasting.  CBC with normal WBC.   Imaging Studies ordered:  I ordered imaging studies including XR left foot  I independently visualized and interpreted imaging which showed negative for bony changes I agree with the radiologist interpretation   Consultations Obtained:  I requested consultation with the Dr. Mikeal Hawthorne, [facility],  and discussed lab and imaging findings as well as pertinent plan - they  recommend: admission    Problem List / ED Course / Critical interventions / Medication management  89 year old male presents with concern for left second toe ulcerated wound secondary to untrimmed toenails with surrounding cellulitis.  No purulent drainage.  Labs without significant findings.  His x-ray is negative for osteomyelitis however the wound probes fairly deep, there is clinical concern for underlying osteomyelitis.  Patient was seen by ER attending, Dr. Clarice Pole, recommends admission. I ordered medication including rocephin  for cellulitis foot  Reevaluation of the patient after these medicines showed  that the patient stayed the same I have reviewed the patients home medicines and have made adjustments as needed   Social Determinants of Health:  Lives at facility, facility unable to trim nails for the past 6 months and unable to care for the toe to the point of needing ER care today   Test / Admission - Considered:  Admit for cellulitis with concern for osteomyelitis          Final Clinical Impression(s) / ED Diagnoses Final diagnoses:  Diabetic ulcer of toe of left foot associated with type 2 diabetes mellitus, with other ulcer severity (HCC)  Cellulitis of left foot    Rx / DC Orders ED Discharge Orders     None         Alden Hipp 11/06/22 2028    Arby Barrette, MD 11/17/22 1149

## 2022-11-06 NOTE — ED Triage Notes (Addendum)
Patient here from [place] with reports of left toe pain. Redness to 3 toes. Reports toenail of big toe growing into second toe. Hard of hearing. Diabetic.

## 2022-11-06 NOTE — H&P (Signed)
History and Physical    Patient: Gerald [name] LKG:[id number] DOB: Jul 22, 1925 DOA: 11/06/2022 DOS: the patient was seen and examined on 11/06/2022 PCP: Pcp, No  Patient coming from: SNF  Chief Complaint:  Chief Complaint  Patient presents with   Toe Pain   HPI: Gerald [name] is a 89 y.o. male with medical history significant of diabetes, essential hypertension, bilateral hearing loss, GERD, macular degeneration, hypothyroidism, hyperlipidemia who [facility] with left foot swelling and redness.  Patient has not had his nails clipped in a while.  He has apparently been refusing.  Came in with swollen second toe on the left.  Red and tender.  Suspicion for osteomyelitis.  Patient appears to have diabetic neuropathy with minimal pain sensation.  In the ER he had workup including x-ray of the foot that showed cellulitis of this left second toe.  No obvious osteomyelitis at the moment.  Patient is being admitted for IV antibiotics.  History is limited due to patient's decreased hearing.  His hearing aid is not available at the moment.  Review of Systems: As mentioned in the history of present illness. All other systems reviewed and are negative. Past Medical History:  Diagnosis Date   Anemia    Diabetes mellitus    Hypertension    Macular degeneration    Dr Ashley Royalty   Stricture    PMH OF   Past Surgical History:  Procedure Laterality Date   CATARACT EXTRACTION     OD   COLONOSCOPY W/ POLYPECTOMY  2009   CORONARY ARTERY BYPASS GRAFT     ESOPHAGEAL DILATION     x 1   ESOPHAGOGASTRODUODENOSCOPY (EGD) WITH PROPOFOL N/A 12/28/2018   Procedure: ESOPHAGOGASTRODUODENOSCOPY (EGD) WITH PROPOFOL;  Surgeon: Lemar Lofty., MD;  Location: [facility];  Service: Gastroenterology;  Laterality: N/A;   FOREIGN BODY REMOVAL  12/28/2018   Procedure: FOREIGN BODY REMOVAL;  Surgeon: Meridee Score Netty Starring., MD;  Location: [facility];  Service:  Gastroenterology;;   INGUINAL HERNIA REPAIR  1998   Bilateral   LUMBAR LAMINECTOMY  1996   Macular Degeneration Surgery     Social History:  reports that he quit smoking about 34 years ago. He has never used smokeless tobacco. He reports that he does not drink alcohol and does not use drugs.  No Known Allergies  Family History  Problem Relation Age of Onset   Colon cancer Brother    Arthritis Father    Hypertension Mother    Coronary artery disease Mother    Stroke Mother 46       ?   Heart attack Mother 79       ?   Diabetes Paternal Grandmother    Diabetes Maternal Grandmother     Prior to Admission medications   Medication Sig Start Date End Date Taking? Authorizing Provider  Blood Glucose Monitoring Suppl (ONE TOUCH ULTRA 2) W/DEVICE KIT Test blood sugar daily. Dx Code: 250.72 01/18/13   Pecola Lawless, MD  diltiazem (CARDIZEM CD) 120 MG 24 hr capsule Take 1 capsule (120 mg total) by mouth daily. 12/31/18   Burnadette Pop, MD  ELIQUIS 2.5 MG TABS tablet Take 1 tablet (2.5 mg total) by mouth 2 (two) times daily. Start on 12/31/18 12/30/18   Burnadette Pop, MD  FEROSUL 325 (65 Fe) MG tablet Take 325 mg by mouth daily. 10/21/22   [provider]  Insulin Glargine (BASAGLAR KWIKPEN) 100 UNIT/ML Inject 10 Units into the skin  at bedtime. 10/28/22   [provider]  Insulin Pen Needle (B-D UF III MINI PEN NEEDLES) 31G X 5 MM MISC USE TO TEST BLOOD SUGARS ICD 10 E11 59 03/13/14   Pecola Lawless, MD  levothyroxine (SYNTHROID) 50 MCG tablet Take 50 mcg by mouth every morning. 10/21/22   [provider]  lisinopril (ZESTRIL) 5 MG tablet Take 5 mg by mouth daily. 11/01/22   [provider]  metoprolol succinate (TOPROL-XL) 25 MG 24 hr tablet Take 25 mg by mouth daily. 12/14/18   [provider]  ONE TOUCH ULTRA TEST test strip CHECK BLOOD GLUCOSE (SUGAR) DAILY 02/15/14   Pecola Lawless, MD  [facility] DELICA LANCETS 33G MISC CHECK BLOOD  GLUCOSE DAILY E11  59 02/24/14   Pecola Lawless, MD  pantoprazole (PROTONIX) 40 MG tablet Take 1 tablet (40 mg total) by mouth 2 (two) times daily. 12/30/18   Burnadette Pop, MD  pravastatin (PRAVACHOL) 10 MG tablet Take 10 mg by mouth daily. 12/23/18   [provider]  sertraline (ZOLOFT) 50 MG tablet Take 50 mg by mouth daily. 12/21/18   [provider]    Physical Exam: Vitals:   11/06/22 1825 11/06/22 1830 11/06/22 1918 11/06/22 2000  BP:  114/67 129/68 (!) 113/56  Pulse: (!) 49 (!) 49 63 75  Resp: 18  18 16   Temp: 98 F (36.7 C)  98.1 F (36.7 C)   TempSrc:   Oral   SpO2: 98% 100% 97% 100%   Constitutional: NAD, calm, comfortable Eyes: PERRL, lids and conjunctivae normal ENMT: Mucous membranes are moist. Posterior pharynx clear of any exudate or lesions.Normal dentition.  Neck: normal, supple, no masses, no thyromegaly Respiratory: clear to auscultation bilaterally, no wheezing, no crackles. Normal respiratory effort. No accessory muscle use.  Cardiovascular: Sinus bradycardia, no murmurs / rubs / gallops. No extremity edema. 2+ pedal pulses. No carotid bruits.  Abdomen: no tenderness, no masses palpated. No hepatosplenomegaly. Bowel sounds positive.  Musculoskeletal: Left foot swollen, tender, second toe ulcerated, Skin: no rashes, lesions, ulcers. No induration Neurologic: CN 2-12 grossly intact. Sensation intact, DTR normal. Strength 5/5 in all 4.  Psychiatric: Normal judgment and insight. Alert and oriented x 3. Normal mood  Data Reviewed:  Glucose 121 BUN 35 creatinine 1.43 calcium 8.1 and albumin 3.4 hemoglobin 10.2 x-ray of the foot showed soft tissue ulceration of the distal aspect of the second toe with no evidence of osteomyelitis  Assessment and Plan:  #1 cellulitis of the left second toe: Suspicion for possible osteo-.  Patient be admitted.  Will consider MRI of the foot if possible.  Evaluate for osteomyelitis and if so podiatry consult.  In the  meantime initiate IV antibiotics.  Blood cultures obtained.  Admit with wound care.  Control blood sugar which is a major risk factor for the patient's condition.  #2 type 2 diabetes: Insulin-dependent.  On Lantus and sliding scale.  Continue the hospital.  #3 essential hypertension: Patient on metoprolol.  Will continue  #4 chronic anticoagulation: Patient is on Eliquis.  Appears to be due to paroxysmal atrial fibrillation.  Patient on 2.5 mg twice a day.  Will hold Eliquis and do Lovenox GI in case patient will need surgical intervention.  Continue diltiazem  #5 hypothyroidism: Continue with levothyroxine  #6 GERD: Continue with PPI  #7 hyperlipidemia: Continue with statin  #8 bilateral hearing loss: Patient has a hearing aid which is not here with him.  Family may bring it 7.  Advance Care Planning:   Code Status: Do not attempt resuscitation (DNR) PRE-ARREST INTERVENTIONS DESIRED   Consults: None but may need podiatry consult  Family Communication: Grandchild over the phone  Severity of Illness: The appropriate patient status for this patient is INPATIENT. Inpatient status is judged to be reasonable and necessary in order to provide the required intensity of service to ensure the patient's safety. The patient's presenting symptoms, physical exam findings, and initial radiographic and laboratory data in the context of their chronic comorbidities is felt to place them at high risk for further clinical deterioration. Furthermore, it is not anticipated that the patient will be medically stable for discharge from the hospital within 2 midnights of admission.   * I certify that at the point of admission it is my clinical judgment that the patient will require inpatient hospital care spanning beyond 2 midnights from the point of admission due to high intensity of service, high risk for further deterioration and high frequency of surveillance required.*  AuthorLonia Blood, MD 11/06/2022  10:30 PM  For on call review [email or web address].

## 2022-11-07 ENCOUNTER — Encounter (HOSPITAL_COMMUNITY): Payer: Self-pay | Admitting: Internal Medicine

## 2022-11-07 DIAGNOSIS — E782 Mixed hyperlipidemia: Secondary | ICD-10-CM

## 2022-11-07 DIAGNOSIS — K219 Gastro-esophageal reflux disease without esophagitis: Secondary | ICD-10-CM

## 2022-11-07 DIAGNOSIS — E118 Type 2 diabetes mellitus with unspecified complications: Secondary | ICD-10-CM

## 2022-11-07 DIAGNOSIS — L089 Local infection of the skin and subcutaneous tissue, unspecified: Secondary | ICD-10-CM

## 2022-11-07 DIAGNOSIS — I10 Essential (primary) hypertension: Secondary | ICD-10-CM

## 2022-11-07 DIAGNOSIS — I480 Paroxysmal atrial fibrillation: Secondary | ICD-10-CM | POA: Diagnosis not present

## 2022-11-07 DIAGNOSIS — E038 Other specified hypothyroidism: Secondary | ICD-10-CM

## 2022-11-07 DIAGNOSIS — Z794 Long term (current) use of insulin: Secondary | ICD-10-CM

## 2022-11-07 DIAGNOSIS — M869 Osteomyelitis, unspecified: Secondary | ICD-10-CM | POA: Diagnosis not present

## 2022-11-07 DIAGNOSIS — L03032 Cellulitis of left toe: Secondary | ICD-10-CM | POA: Diagnosis not present

## 2022-11-07 DIAGNOSIS — E1169 Type 2 diabetes mellitus with other specified complication: Secondary | ICD-10-CM

## 2022-11-07 DIAGNOSIS — E11628 Type 2 diabetes mellitus with other skin complications: Secondary | ICD-10-CM

## 2022-11-07 LAB — BLOOD CULTURE ID PANEL (REFLEXED) - BCID2

## 2022-11-07 LAB — CBC
HCT: 33.1 % — ABNORMAL LOW (ref 39.0–52.0)
Hemoglobin: 10 g/dL — ABNORMAL LOW (ref 13.0–17.0)
MCH: 27 pg (ref 26.0–34.0)
MCHC: 30.2 g/dL (ref 30.0–36.0)
MCV: 89.5 fL (ref 80.0–100.0)
Platelets: 220 10*3/uL (ref 150–400)
RBC: 3.7 MIL/uL — ABNORMAL LOW (ref 4.22–5.81)
RDW: 15.2 % (ref 11.5–15.5)
WBC: 4.9 10*3/uL (ref 4.0–10.5)
nRBC: 0 % (ref 0.0–0.2)

## 2022-11-07 LAB — COMPREHENSIVE METABOLIC PANEL
ALT: 12 U/L (ref 0–44)
AST: 17 U/L (ref 15–41)
Albumin: 3.3 g/dL — ABNORMAL LOW (ref 3.5–5.0)
Alkaline Phosphatase: 49 U/L (ref 38–126)
Anion gap: 9 (ref 5–15)
BUN: 31 mg/dL — ABNORMAL HIGH (ref 8–23)
CO2: 23 mmol/L (ref 22–32)
Calcium: 7.6 mg/dL — ABNORMAL LOW (ref 8.9–10.3)
Chloride: 102 mmol/L (ref 98–111)
Creatinine, Ser: 1.35 mg/dL — ABNORMAL HIGH (ref 0.61–1.24)
GFR, Estimated: 48 mL/min — ABNORMAL LOW (ref >60)
Glucose, Bld: 105 mg/dL — ABNORMAL HIGH (ref 70–99)
Potassium: 3.8 mmol/L (ref 3.5–5.1)
Sodium: 134 mmol/L — ABNORMAL LOW (ref 135–145)
Total Bilirubin: 0.3 mg/dL (ref 0.3–1.2)
Total Protein: 6.9 g/dL (ref 6.5–8.1)

## 2022-11-07 LAB — HEMOGLOBIN A1C
Hgb A1c MFr Bld: 6.1 % — ABNORMAL HIGH (ref 4.8–5.6)
Mean Plasma Glucose: 128.37 mg/dL

## 2022-11-07 LAB — GLUCOSE, CAPILLARY
Glucose-Capillary: 145 mg/dL — ABNORMAL HIGH (ref 70–99)
Glucose-Capillary: 80 mg/dL (ref 70–99)
Glucose-Capillary: 85 mg/dL (ref 70–99)
Glucose-Capillary: 90 mg/dL (ref 70–99)
Glucose-Capillary: 94 mg/dL (ref 70–99)

## 2022-11-07 MED ORDER — PNEUMOCOCCAL 20-VAL CONJ VACC 0.5 ML IM SUSY: 0.5000 mL | PREFILLED_SYRINGE | INTRAMUSCULAR | Status: DC | PRN

## 2022-11-07 MED ORDER — GADOBUTROL 1 MMOL/ML IV SOLN
6.0000 mL | Freq: Once | INTRAVENOUS | Status: AC | PRN
  Administered 2022-11-07: 6 mL via INTRAVENOUS

## 2022-11-07 MED ORDER — VANCOMYCIN HCL 1250 MG/250ML IV SOLN
1250.0000 mg | Freq: Once | INTRAVENOUS | Status: AC
  Administered 2022-11-07: 1250 mg via INTRAVENOUS
  Filled 2022-11-07: qty 250

## 2022-11-07 MED ORDER — METRONIDAZOLE 500 MG PO TABS
500.0000 mg | ORAL_TABLET | Freq: Two times a day (BID) | ORAL | Status: DC
  Administered 2022-11-07 (×2): 500 mg via ORAL
  Filled 2022-11-07 (×2): qty 1

## 2022-11-07 MED ORDER — VANCOMYCIN HCL 1250 MG/250ML IV SOLN: 1250.0000 mg | INTRAVENOUS | Status: DC

## 2022-11-07 MED ORDER — SODIUM CHLORIDE 0.9 % IV SOLN
2.0000 g | INTRAVENOUS | Status: DC
  Administered 2022-11-07: 2 g via INTRAVENOUS
  Filled 2022-11-07: qty 20

## 2022-11-07 MED ORDER — INFLUENZA VAC A&B SURF ANT ADJ 0.5 ML IM SUSY: 0.5000 mL | PREFILLED_SYRINGE | INTRAMUSCULAR | Status: DC | PRN

## 2022-11-07 NOTE — Plan of Care (Signed)
  Problem: Clinical Measurements: Goal: Cardiovascular complication will be avoided Outcome: Progressing   Problem: Coping: Goal: Level of anxiety will decrease Outcome: Progressing

## 2022-11-07 NOTE — Plan of Care (Signed)
[facility] Tonsillectomy/Adenoidectomy, Postoperative PEDS care plan entered in error.

## 2022-11-07 NOTE — Progress Notes (Signed)
  PROGRESS NOTE   Gerald [name]  ZHY:[id number] DOB: Jul 14, 1925 DOA: 11/06/2022 PCP: Pcp, No   Date of Service: the patient was seen and examined on 11/07/2022  Brief Narrative:  No notes on file   Assessment and Plan: No notes have been filed under this hospital service. Service: Hospitalist       Subjective:  ***  Physical Exam:  Vitals:   11/06/22 2238 11/06/22 2328 11/06/22 2348 11/07/22 0431  BP: 96/77  124/61 (!) 106/50  Pulse: (!) 45  79 75  Resp: 18  18 18   Temp: 98.6 F (37 C)  98.5 F (36.9 C) 98.1 F (36.7 C)  TempSrc:   Oral Oral  SpO2: 95%  100% 100%  Weight:  64.6 kg    Height:  5\' 6"  (1.676 m)      *** Constitutional: Awake alert and oriented x3, no associated distress.   Skin: no rashes, no lesions, good skin turgor noted. Eyes: Pupils are equally reactive to light.  No evidence of scleral icterus or conjunctival pallor.  ENMT: Moist mucous membranes noted.  Posterior pharynx clear of any exudate or lesions.   Respiratory: clear to auscultation bilaterally, no wheezing, no crackles. Normal respiratory effort. No accessory muscle use.  Cardiovascular: Regular rate and rhythm, no murmurs / rubs / gallops. No extremity edema. 2+ pedal pulses. No carotid bruits.  Abdomen: Abdomen is soft and nontender.  No evidence of intra-abdominal masses.  Positive bowel sounds noted in all quadrants.   Musculoskeletal: No joint deformity upper and lower extremities. Good ROM, no contractures. Normal muscle tone.    Data Reviewed:  I have personally reviewed and interpreted labs, imaging.  Significant findings are ***  CBC: Recent Labs  Lab 11/06/22 1924 11/07/22 0056  WBC 5.2 4.9  NEUTROABS 3.6  --   HGB 10.2* 10.0*  HCT 33.4* 33.1*  MCV 88.6 89.5  PLT 223 220   Basic Metabolic Panel: Recent Labs  Lab 11/06/22 1924 11/07/22 0056  NA 137 134*  K 4.5 3.8  CL 103 102  CO2 25 23  GLUCOSE 121* 105*  BUN 35* 31*  CREATININE 1.43* 1.35*   CALCIUM 8.1* 7.6*   GFR: Estimated Creatinine Clearance: 29.5 mL/min (A) (by C-G formula based on SCr of 1.35 mg/dL (H)). Liver Function Tests: Recent Labs  Lab 11/06/22 1924 11/07/22 0056  AST 15 17  ALT 13 12  ALKPHOS 51 49  BILITOT 0.4 0.3  PROT 7.1 6.9  ALBUMIN 3.4* 3.3*    Coagulation Profile: No results for input(s): "INR", "PROTIME" in the last 168 hours.   EKG/Telemetry: Personally reviewed.  Rhythm is *** with heart rate of ***.  No dynamic ST segment changes appreciated.   Code Status:  {Palliative Code status:23503}.  Code status decision has been confirmed with: *** Family Communication: ***    Severity of Illness:  {Observation/Inpatient:21159}  Time spent:  *** minutes  Author:  Marinda Elk MD  11/07/2022 8:51 AM

## 2022-11-07 NOTE — Progress Notes (Signed)
Pharmacy Antibiotic Note  Gerald [name] is a 89 y.o. male admitted on 11/06/2022 with diabetic foot ulcer, cellulitis, r/o osteomyelitis.  MRI pending.  Pharmacy has been consulted for vancomycin dosing.  Plan: Ceftriaxone and metronidazole per MD.  Vancomycin 1250mg  IV x1 then q48h    (SCr 1.35, est AUC 462) Measure Vanc levels as needed.  Goal AUC = 400 - 550 Follow up renal function, culture results, and clinical course.   Height: 5\' 6"  (167.6 cm) Weight: 64.6 kg (142 lb 6.7 oz) IBW/kg (Calculated) : 63.8  Temp (24hrs), Avg:98.3 F (36.8 C), Min:98 F (36.7 C), Max:98.6 F (37 C)  Recent Labs  Lab 11/06/22 1917 11/06/22 1924 11/07/22 0056  WBC  --  5.2 4.9  CREATININE  --  1.43* 1.35*  LATICACIDVEN 0.7  --   --     Estimated Creatinine Clearance: 29.5 mL/min (A) (by C-G formula based on SCr of 1.35 mg/dL (H)).    No Known Allergies  Antimicrobials this admission: 10/17 Cefazolin >> 10/18 10/18 Ceftriaxone >> 10/18 metronidazole >>  10/18 vancomycin >>   Dose adjustments this admission:   Microbiology results: 10/17 BCx:   Thank you for allowing pharmacy to be a part of this patient's care.  Lynann Beaver PharmD, BCPS [facility] main pharmacy [phone] 11/07/2022 9:52 AM

## 2022-11-07 NOTE — Progress Notes (Signed)
PHARMACY - PHYSICIAN COMMUNICATION CRITICAL VALUE ALERT - BLOOD CULTURE IDENTIFICATION (BCID)  Gerald [name] is an 89 y.o. male who presented to [facility] on 11/06/2022 with a chief complaint of osteomyelitis  Assessment:  1/r GPC, staph species  Name of physician (or Provider) ContactedVirgel Manifold  Current antibiotics: vanc, CTX, flagyl  Changes to prescribed antibiotics recommended:  Patient is on recommended antibiotics - No changes needed  Results for orders placed or performed during the hospital encounter of 11/06/22  Blood Culture ID Panel (Reflexed) (Collected: 11/06/2022  7:00 PM)  Result Value Ref Range   Enterococcus faecalis NOT DETECTED NOT DETECTED   Enterococcus Faecium NOT DETECTED NOT DETECTED   Listeria monocytogenes NOT DETECTED NOT DETECTED   Staphylococcus species DETECTED (A) NOT DETECTED   Staphylococcus aureus (BCID) NOT DETECTED NOT DETECTED   Staphylococcus epidermidis NOT DETECTED NOT DETECTED   Staphylococcus lugdunensis NOT DETECTED NOT DETECTED   Streptococcus species NOT DETECTED NOT DETECTED   Streptococcus agalactiae NOT DETECTED NOT DETECTED   Streptococcus pneumoniae NOT DETECTED NOT DETECTED   Streptococcus pyogenes NOT DETECTED NOT DETECTED   A.calcoaceticus-baumannii NOT DETECTED NOT DETECTED   Bacteroides fragilis NOT DETECTED NOT DETECTED   Enterobacterales NOT DETECTED NOT DETECTED   Enterobacter cloacae complex NOT DETECTED NOT DETECTED   Escherichia coli NOT DETECTED NOT DETECTED   Klebsiella aerogenes NOT DETECTED NOT DETECTED   Klebsiella oxytoca NOT DETECTED NOT DETECTED   Klebsiella pneumoniae NOT DETECTED NOT DETECTED   Proteus species NOT DETECTED NOT DETECTED   Salmonella species NOT DETECTED NOT DETECTED   Serratia marcescens NOT DETECTED NOT DETECTED   Haemophilus influenzae NOT DETECTED NOT DETECTED   Neisseria meningitidis NOT DETECTED NOT DETECTED   Pseudomonas aeruginosa NOT DETECTED NOT DETECTED   Stenotrophomonas  maltophilia NOT DETECTED NOT DETECTED   Candida albicans NOT DETECTED NOT DETECTED   Candida auris NOT DETECTED NOT DETECTED   Candida glabrata NOT DETECTED NOT DETECTED   Candida krusei NOT DETECTED NOT DETECTED   Candida parapsilosis NOT DETECTED NOT DETECTED   Candida tropicalis NOT DETECTED NOT DETECTED   Cryptococcus neoformans/gattii NOT DETECTED NOT DETECTED    Arley Phenix RPh 11/07/2022, 11:06 PM

## 2022-11-07 NOTE — Consult Note (Signed)
WOC Nurse Consult Note: this consult performed remotely after review of EMR including photo documentation and chat with bedside nurse  Reason for Consult: L 2nd toe cellulitis  Wound type: full thickness, traumatic from rubbing of great toenail  Pressure Injury POA: NA  Measurement: see nursing flowsheet  Wound bed: 50% pink moist 50% yellow/brown necrotic  Drainage (amount, consistency, odor)  see nursing flowsheet  Periwound: erythema, edema, heavily callused around ulcer  Dressing procedure/placement/frequency:  Clean L 2nd toe ulcer with Vashe wound cleanser Hart Rochester [phone]), apply a small piece of Vashe moistened gauze to wound bed twice daily, cover with dry gauze and secure with foam or Kerlix roll gauze.    Patient is pending MRI results to rule out osteomyelitis. Per MD notes if MRI + for osteomyelitis referral will be made to podiatry. Any podiatry wound care orders that are placed supercede WOC wound care orders.   POC discussed with bedside nurse. WOC team will not follow at this time. Re-consult if further needs arise.   Thank you,    Priscella Mann MSN, RN-BC, [employer] [phone]

## 2022-11-08 DIAGNOSIS — M869 Osteomyelitis, unspecified: Secondary | ICD-10-CM | POA: Diagnosis present

## 2022-11-08 DIAGNOSIS — E039 Hypothyroidism, unspecified: Secondary | ICD-10-CM | POA: Diagnosis present

## 2022-11-08 DIAGNOSIS — I480 Paroxysmal atrial fibrillation: Secondary | ICD-10-CM | POA: Diagnosis present

## 2022-11-08 DIAGNOSIS — E11628 Type 2 diabetes mellitus with other skin complications: Secondary | ICD-10-CM | POA: Diagnosis present

## 2022-11-08 DIAGNOSIS — E118 Type 2 diabetes mellitus with unspecified complications: Secondary | ICD-10-CM

## 2022-11-08 DIAGNOSIS — E119 Type 2 diabetes mellitus without complications: Secondary | ICD-10-CM

## 2022-11-08 DIAGNOSIS — E1169 Type 2 diabetes mellitus with other specified complication: Secondary | ICD-10-CM | POA: Diagnosis present

## 2022-11-08 DIAGNOSIS — L03032 Cellulitis of left toe: Secondary | ICD-10-CM | POA: Diagnosis not present

## 2022-11-08 LAB — COMPREHENSIVE METABOLIC PANEL
ALT: 11 U/L (ref 0–44)
AST: 16 U/L (ref 15–41)
Albumin: 3 g/dL — ABNORMAL LOW (ref 3.5–5.0)
Alkaline Phosphatase: 51 U/L (ref 38–126)
Anion gap: 9 (ref 5–15)
BUN: 22 mg/dL (ref 8–23)
CO2: 25 mmol/L (ref 22–32)
Calcium: 8 mg/dL — ABNORMAL LOW (ref 8.9–10.3)
Chloride: 101 mmol/L (ref 98–111)
Creatinine, Ser: 1.19 mg/dL (ref 0.61–1.24)
GFR, Estimated: 56 mL/min — ABNORMAL LOW (ref >60)
Glucose, Bld: 93 mg/dL (ref 70–99)
Potassium: 4.3 mmol/L (ref 3.5–5.1)
Sodium: 135 mmol/L (ref 135–145)
Total Bilirubin: 0.5 mg/dL (ref 0.3–1.2)
Total Protein: 6.5 g/dL (ref 6.5–8.1)

## 2022-11-08 LAB — CBC WITH DIFFERENTIAL/PLATELET
Abs Immature Granulocytes: 0.03 10*3/uL (ref 0.00–0.07)
Basophils Absolute: 0 10*3/uL (ref 0.0–0.1)
Basophils Relative: 1 %
Eosinophils Absolute: 0.1 10*3/uL (ref 0.0–0.5)
Eosinophils Relative: 1 %
HCT: 33.6 % — ABNORMAL LOW (ref 39.0–52.0)
Hemoglobin: 10.4 g/dL — ABNORMAL LOW (ref 13.0–17.0)
Immature Granulocytes: 1 %
Lymphocytes Relative: 19 %
Lymphs Abs: 1 10*3/uL (ref 0.7–4.0)
MCH: 26.9 pg (ref 26.0–34.0)
MCHC: 31 g/dL (ref 30.0–36.0)
MCV: 86.8 fL (ref 80.0–100.0)
Monocytes Absolute: 0.6 10*3/uL (ref 0.1–1.0)
Monocytes Relative: 11 %
Neutro Abs: 3.5 10*3/uL (ref 1.7–7.7)
Neutrophils Relative %: 67 %
Platelets: 223 10*3/uL (ref 150–400)
RBC: 3.87 MIL/uL — ABNORMAL LOW (ref 4.22–5.81)
RDW: 15.1 % (ref 11.5–15.5)
WBC: 5.2 10*3/uL (ref 4.0–10.5)
nRBC: 0 % (ref 0.0–0.2)

## 2022-11-08 LAB — C-REACTIVE PROTEIN: CRP: 3.8 mg/dL — ABNORMAL HIGH (ref <1.0)

## 2022-11-08 LAB — GLUCOSE, CAPILLARY
Glucose-Capillary: 78 mg/dL (ref 70–99)
Glucose-Capillary: 117 mg/dL — ABNORMAL HIGH (ref 70–99)
Glucose-Capillary: 97 mg/dL (ref 70–99)
Glucose-Capillary: 141 mg/dL — ABNORMAL HIGH (ref 70–99)

## 2022-11-08 LAB — MAGNESIUM: Magnesium: 2.2 mg/dL (ref 1.7–2.4)

## 2022-11-08 MED ORDER — DILTIAZEM HCL ER COATED BEADS 120 MG PO CP24
120.0000 mg | ORAL_CAPSULE | Freq: Every day | ORAL | Status: DC
  Administered 2022-11-08 – 2022-11-09 (×2): 120 mg via ORAL
  Filled 2022-11-08 (×2): qty 1

## 2022-11-08 MED ORDER — DOXYCYCLINE HYCLATE 100 MG PO TABS: 100.0000 mg | ORAL_TABLET | Freq: Two times a day (BID) | ORAL | Status: DC

## 2022-11-08 MED ORDER — AMOXICILLIN-POT CLAVULANATE 875-125 MG PO TABS: 1.0000 | ORAL_TABLET | Freq: Two times a day (BID) | ORAL | Status: DC

## 2022-11-08 MED ORDER — DOXYCYCLINE HYCLATE 100 MG PO TABS
100.0000 mg | ORAL_TABLET | Freq: Two times a day (BID) | ORAL | Status: DC
  Administered 2022-11-08 – 2022-11-09 (×3): 100 mg via ORAL
  Filled 2022-11-08 (×3): qty 1

## 2022-11-08 MED ORDER — LISINOPRIL 5 MG PO TABS
5.0000 mg | ORAL_TABLET | Freq: Every day | ORAL | Status: DC
  Administered 2022-11-08 – 2022-11-09 (×2): 5 mg via ORAL
  Filled 2022-11-08 (×2): qty 1

## 2022-11-08 MED ORDER — APIXABAN 2.5 MG PO TABS
2.5000 mg | ORAL_TABLET | Freq: Two times a day (BID) | ORAL | Status: DC
  Administered 2022-11-08 – 2022-11-09 (×3): 2.5 mg via ORAL
  Filled 2022-11-08 (×3): qty 1

## 2022-11-08 MED ORDER — AMOXICILLIN-POT CLAVULANATE 875-125 MG PO TABS
1.0000 | ORAL_TABLET | Freq: Two times a day (BID) | ORAL | Status: DC
  Administered 2022-11-08 – 2022-11-09 (×3): 1 via ORAL
  Filled 2022-11-08 (×3): qty 1

## 2022-11-08 NOTE — Assessment & Plan Note (Signed)
Patient been placed on Accu-Cheks before every meal and nightly with sliding scale insulin Continuing home regimen of basal insulin therapy. Hemoglobin A1C 6.1% Diabetic Diet

## 2022-11-08 NOTE — Discharge Summary (Signed)
Physician Discharge Summary   Patient: Gerald [name] MRN: [id number] DOB: Apr 08, 1925  Admit date:     11/06/2022  Discharge date: 11/09/22  Discharge Physician: Marinda Elk   PCP: Pcp, No   Recommendations at discharge:    Patient is DNR Patient has a wound on the second toe of the left foot, please provide the following wound care: Vashe wound cleanser apply a small piece of Vashe moistened gauze to wound bed twice daily, cover with dry gauze and secure with foam or Kerlix roll gauze.  Please ensure that the gauze is situated so that it is separating the first and second toe so that the toenail the first toe does not continue to chafe the second toe and cause residual injury. Patient has been prescribed 40 days of oral Doxycycline and Augmentin for osteomyelitis of the second toe of the left foot. Patient has additionally been prescribed a 40-month supply of terbinafine for treatment of suspected onychomycosis of the toes of the left foot.  It is extremely important that the patient have his liver enzymes checked in approximately 4 weeks to ensure there is no evidence of liver toxicity. Please ensure that the patient follows up with Dr. Victorino Dike with orthopedic surgery in 1 to 2 weeks Please ensure that patient's toenails are trimmed as soon as possible.  Additionally please make a first available appointment with [facility] to establish diabetic footcare and management of his toenails. Patient is to be brought back to the emergency department if he develops worsening pain, swelling, fevers, confusion.   Discharge Diagnoses: Principal Problem:   Cellulitis and abscess of toe of left foot Active Problems:   Osteomyelitis of second toe of left foot (HCC)   Diabetic infection of left foot (HCC)   Paroxysmal atrial fibrillation (HCC)   Onychomycosis of left great toe   Essential hypertension   GERD without esophagitis   Type 2 diabetes mellitus with complication,  with long-term current use of insulin (HCC)   Mixed diabetic hyperlipidemia associated with type 2 diabetes mellitus (HCC)   Hypothyroidism   Hyperlipidemia   Hearing loss of both ears   Diabetes mellitus without complication (HCC)  Resolved Problems:   * No resolved hospital problems. *   Hospital Course: 89 y.o. male with medical history significant of insulin-dependent diabetes mellitus type 2, essential hypertension, bilateral hearing loss, GERD, macular degeneration, hypothyroidism, hyperlipidemia paroxysmal atrial fibrillation who presents from skilled nursing facility with left foot swelling and redness.   In the emergency department patient was diagnosed with infected ulceration of the second toe of the left foot and surrounding cellulitis.   Patient was initiated on intravenous antibiotics.  The hospitalist group was then called to assess the patient for admission to the hospital.  Patient was admitted to hospital service and initiated on appropriate intravenous antibiotics for potential osteomyelitis with vancomycin, Flagyl and ceftriaxone.  MRI of the left foot was obtained revealing osteomyelitis of the second toe of the left foot.  The onset of the ulceration was felt to be secondary to patient's poor maintenance of his toenails which are markedly overgrown with the toenail of the great toe of the left foot chafing against the second toe.  This propagated as a diabetic foot infection and eventually developed osteomyelitis.  The case was discussed with Dr. Victorino Dike with orthopedic surgery.  Dr. Victorino Dike stated that in the absence of sepsis patient would best be served by an outpatient operative intervention as opposed to urgent inpatient  intervention.  Patient was treated with intravenous antibiotics with improvement in the patient's left foot swelling redness and pain.  Patient was eventually transition to oral doxycycline and Augmentin to complete a total of 6 weeks of antibiotic therapy.   Patient was additionally prescribed a 39-month course of terbinafine for treatment of suspected onychomycosis of the toenails of the left foot.  At time of discharge, the patient is receiving facilities being instructed to check his liver enzymes approximately 4 weeks after initiation of therapy to ensure there is no evidence of liver toxicity.    Patient was discharged back to his facility in stable and improved condition with instructions to follow-up with Dr. [facility]ino Dike with [facility] in 1 to 2 weeks in the outpatient setting as well as with [facility] for continual footcare and management of his toenails.    Consultants: Case and images reviewed with Dr. Victorino Dike with [facility]. Procedures performed: None  Disposition: Skilled nursing facility Diet recommendation:  Discharge Diet Orders (From admission, onward)     Start     Ordered   11/08/22 0000  Diet - low sodium heart healthy        11/08/22 0717   11/08/22 0000  Diet Carb Modified        11/08/22 0717           Cardiac and Carb modified diet  DISCHARGE MEDICATION: Allergies as of 11/09/2022   No Known Allergies      Medication List     TAKE these medications    amoxicillin-clavulanate 875-125 MG tablet Commonly known as: AUGMENTIN Take 1 tablet by mouth every 12 (twelve) hours.   Basaglar KwikPen 100 UNIT/ML Inject 10 Units into the skin at bedtime.   diltiazem 120 MG 24 hr capsule Commonly known as: CARDIZEM CD Take 1 capsule (120 mg total) by mouth daily.   doxycycline 100 MG tablet Commonly known as: VIBRA-TABS Take 1 tablet (100 mg total) by mouth every 12 (twelve) hours.   Eliquis 2.5 MG Tabs tablet Generic drug: apixaban Take 1 tablet (2.5 mg total) by mouth 2 (two) times daily. Start on 12/31/18   FeroSul 325 (65 Fe) MG tablet Generic drug: ferrous sulfate Take 325 mg by mouth daily.   Insulin Pen Needle 31G X 5 MM Misc Commonly known as: B-D UF  III MINI PEN NEEDLES USE TO TEST BLOOD SUGARS ICD 10 E11 59   levothyroxine 50 MCG tablet Commonly known as: SYNTHROID Take 50 mcg by mouth every morning.   lisinopril 5 MG tablet Commonly known as: ZESTRIL Take 5 mg by mouth daily.   metoprolol succinate 25 MG 24 hr tablet Commonly known as: TOPROL-XL Take 25 mg by mouth daily.   ONE TOUCH ULTRA 2 w/Device Kit Test blood sugar daily. Dx Code: 250.72   ONE TOUCH ULTRA TEST test strip Generic drug: glucose blood CHECK BLOOD GLUCOSE (SUGAR) DAILY   OneTouch Delica Lancets 33G Misc CHECK BLOOD GLUCOSE DAILY E11  59   pantoprazole 40 MG tablet Commonly known as: PROTONIX Take 1 tablet (40 mg total) by mouth 2 (two) times daily.   pravastatin 10 MG tablet Commonly known as: PRAVACHOL Take 10 mg by mouth daily.   sertraline 50 MG tablet Commonly known as: ZOLOFT Take 50 mg by mouth daily.   terbinafine 250 MG tablet Commonly known as: LAMISIL Take 1 tablet (250 mg total) by mouth daily.  Discharge Care Instructions  (From admission, onward)           Start     Ordered   11/08/22 0000  Discharge wound care:       Comments: Vashe wound cleanser Hart Rochester [phone]), apply a small piece of Vashe moistened gauze to wound bed twice daily, cover with dry gauze and secure with foam or Kerlix roll gauze.   11/08/22 0454            Follow-up Information     Toni Arthurs, MD. Schedule an appointment as soon as possible for a visit in 1 week(s).   Specialty: Orthopedic Surgery Why: Make appointment in 1-2 weeks with Dr. Victorino Dike or one of his associates Contact information: [address] [place] [address] [phone]         [facility]. Schedule an appointment as soon as possible for a visit.   Contact information: [address] [place] [phone]                Discharge Exam: Ceasar Mons Weights   11/06/22 2328  Weight: 64.6 kg     Constitutional: Awake alert and oriented, extremely hard of hearing, no associated distress.   Respiratory: clear to auscultation bilaterally, no wheezing, no crackles. Normal respiratory effort. No accessory muscle use.  Cardiovascular: Regular rate and rhythm, no murmurs / rubs / gallops. No extremity edema. 2+ pedal pulses. No carotid bruits.  Abdomen: Abdomen is soft and nontender.  No evidence of intra-abdominal masses.  Positive bowel sounds noted in all quadrants.   Musculoskeletal: Dressing of left foot is clean dry and intact.  Good ROM, no contractures. Normal muscle tone.     Condition at discharge: fair  The results of significant diagnostics from this hospitalization (including imaging, microbiology, ancillary and laboratory) are listed below for reference.   Imaging Studies: MR FOOT LEFT W WO CONTRAST  Result Date: 11/07/2022 CLINICAL DATA:  Left forefoot infection.  Second toe ulcer. EXAM: MRI OF THE LEFT FOREFOOT WITHOUT AND WITH CONTRAST TECHNIQUE: Multiplanar, multisequence MR imaging of the left forefoot was performed both before and after administration of intravenous contrast. CONTRAST:  6mL GADAVIST GADOBUTROL 1 MMOL/ML IV SOLN COMPARISON:  Left foot x-rays from yesterday. FINDINGS: Bones/Joint/Cartilage Abnormal marrow edema and patchy enhancement involving the second middle and distal phalanges (series 7, images 17 and 18). No fracture or dislocation. Mild first MTP joint degenerative changes. No joint effusion. Ligaments Collateral ligaments are intact.  Lisfranc ligament is intact. Muscles and Tendons Flexor and extensor tendons are intact. No tenosynovitis. Increased T2 signal within and atrophy of the intrinsic muscles of the forefoot, nonspecific, but likely related to diabetic muscle changes. Soft tissue Superficial ulceration along the dorsal medial aspect of the second toe. Diffuse soft tissue swelling and enhancement of the second toe extending into the dorsal  forefoot. No fluid collection. No soft tissue mass. IMPRESSION: 1. Superficial ulceration along the dorsal medial aspect of the second toe with underlying osteomyelitis of the second middle and distal phalanges. 2. Cellulitis of the second toe extending into the dorsal forefoot. No abscess. Electronically Signed   By: Obie Dredge M.D.   On: 11/07/2022 11:02   DG Foot Complete Left  Result Date: 11/06/2022 CLINICAL DATA:  2nd toe ulcer. EXAM: LEFT FOOT - COMPLETE 3+ VIEW COMPARISON:  None Available. FINDINGS: There is soft tissue ulceration of the distal aspect of the second toe. There is no foreign body or cortical erosion. No acute  fracture or dislocation. Peripheral vascular calcifications are present. IMPRESSION: Soft tissue ulceration of the distal aspect of the second toe. No radiographic evidence of osteomyelitis. Electronically Signed   By: Darliss Cheney M.D.   On: 11/06/2022 19:48    Microbiology: Results for orders placed or performed during the hospital encounter of 11/06/22  Culture, blood (routine x 2)     Status: Abnormal   Collection Time: 11/06/22  7:00 PM   Specimen: BLOOD  Result Value Ref Range Status   Specimen Description   Final    BLOOD ARTERY Performed at [facility], [address], [place], [place] [address]    Special Requests   Final    BOTTLES DRAWN AEROBIC AND ANAEROBIC Blood Culture results may not be optimal due to an excessive volume of blood received in culture bottles Performed at [facility], [address], [place], [place] [address]    Culture  Setup Time   Final    GRAM POSITIVE COCCI BOTTLES DRAWN AEROBIC ONLY CRITICAL RESULT CALLED TO, READ BACK BY AND VERIFIED WITH: PHARMD EJean Rosenthal [phone] @ 2301 FH    Culture (A)  Final    STAPHYLOCOCCUS SPECIES (COAGULASE NEGATIVE) THE SIGNIFICANCE OF ISOLATING THIS ORGANISM FROM A SINGLE SET OF BLOOD CULTURES WHEN MULTIPLE SETS ARE DRAWN IS UNCERTAIN. PLEASE NOTIFY  THE MICROBIOLOGY DEPARTMENT WITHIN ONE WEEK IF SPECIATION AND SENSITIVITIES ARE REQUIRED. Performed at [facility], [address] [place], [place] [address]    Report Status 11/09/2022 FINAL  Final  Blood Culture ID Panel (Reflexed)     Status: Abnormal   Collection Time: 11/06/22  7:00 PM  Result Value Ref Range Status   Enterococcus faecalis NOT DETECTED NOT DETECTED Final   Enterococcus Faecium NOT DETECTED NOT DETECTED Final   Listeria monocytogenes NOT DETECTED NOT DETECTED Final   Staphylococcus species DETECTED (A) NOT DETECTED Final    Comment: CRITICAL RESULT CALLED TO, READ BACK BY AND VERIFIED WITH: PHARMD EJean Rosenthal [phone] @ 2301 FH    Staphylococcus aureus (BCID) NOT DETECTED NOT DETECTED Final   Staphylococcus epidermidis NOT DETECTED NOT DETECTED Final   Staphylococcus lugdunensis NOT DETECTED NOT DETECTED Final   Streptococcus species NOT DETECTED NOT DETECTED Final   Streptococcus agalactiae NOT DETECTED NOT DETECTED Final   Streptococcus pneumoniae NOT DETECTED NOT DETECTED Final   Streptococcus pyogenes NOT DETECTED NOT DETECTED Final   A.calcoaceticus-baumannii NOT DETECTED NOT DETECTED Final   Bacteroides fragilis NOT DETECTED NOT DETECTED Final   Enterobacterales NOT DETECTED NOT DETECTED Final   Enterobacter cloacae complex NOT DETECTED NOT DETECTED Final   Escherichia coli NOT DETECTED NOT DETECTED Final   Klebsiella aerogenes NOT DETECTED NOT DETECTED Final   Klebsiella oxytoca NOT DETECTED NOT DETECTED Final   Klebsiella pneumoniae NOT DETECTED NOT DETECTED Final   Proteus species NOT DETECTED NOT DETECTED Final   Salmonella species NOT DETECTED NOT DETECTED Final   Serratia marcescens NOT DETECTED NOT DETECTED Final   Haemophilus influenzae NOT DETECTED NOT DETECTED Final   Neisseria meningitidis NOT DETECTED NOT DETECTED Final   Pseudomonas aeruginosa NOT DETECTED NOT DETECTED Final   Stenotrophomonas maltophilia NOT DETECTED NOT DETECTED Final    Candida albicans NOT DETECTED NOT DETECTED Final   Candida auris NOT DETECTED NOT DETECTED Final   Candida glabrata NOT DETECTED NOT DETECTED Final   Candida krusei NOT DETECTED NOT DETECTED Final   Candida parapsilosis NOT DETECTED NOT DETECTED Final   Candida tropicalis NOT DETECTED NOT  DETECTED Final   Cryptococcus neoformans/gattii NOT DETECTED NOT DETECTED Final    Comment: Performed at [facility], [address], [place], [place] [address]  Culture, blood (routine x 2)     Status: None (Preliminary result)   Collection Time: 11/07/22 12:56 AM   Specimen: BLOOD  Result Value Ref Range Status   Specimen Description   Final    BLOOD LEFT ANTECUBITAL Performed at [facility], [address] [place], [place] [address]    Special Requests   Final    BOTTLES DRAWN AEROBIC AND ANAEROBIC Blood Culture adequate volume Performed at [facility], [address], [place], [place] [address]    Culture   Final    NO GROWTH 2 DAYS Performed at [facility], [address], [place], [place] [address]    Report Status PENDING  Incomplete    Labs: CBC: Recent Labs  Lab 11/06/22 1924 11/07/22 0056 11/08/22 0706 11/09/22 0556  WBC 5.2 4.9 5.2 5.2  NEUTROABS 3.6  --  3.5 3.5  HGB 10.2* 10.0* 10.4* 10.5*  HCT 33.4* 33.1* 33.6* 34.7*  MCV 88.6 89.5 86.8 89.2  PLT 223 220 223 240   Basic Metabolic Panel: Recent Labs  Lab 11/06/22 1924 11/07/22 0056 11/08/22 0706 11/09/22 0556  NA 137 134* 135 134*  K 4.5 3.8 4.3 4.1  CL 103 102 101 104  CO2 25 23 25 22   GLUCOSE 121* 105* 93 85  BUN 35* 31* 22 18  CREATININE 1.43* 1.35* 1.19 1.18  CALCIUM 8.1* 7.6* 8.0* 7.9*  MG  --   --  2.2 2.2   Liver Function Tests: Recent Labs  Lab 11/06/22 1924 11/07/22 0056 11/08/22 0706 11/09/22 0556  AST 15 17 16 18   ALT 13 12 11 9   ALKPHOS 51 49 51 46  BILITOT 0.4 0.3 0.5 0.6  PROT 7.1 6.9 6.5 6.5  ALBUMIN 3.4* 3.3* 3.0* 2.9*    CBG: Recent Labs  Lab 11/08/22 1159 11/08/22 1635 11/08/22 2117 11/09/22 0735 11/09/22 1116  GLUCAP 117* 97 141* 83 137*    Discharge time spent: greater than 30 minutes.  Signed: Marinda Elk, MD [facility] 11/09/2022

## 2022-11-08 NOTE — Assessment & Plan Note (Signed)
Continue metoprolol, diltiazem Continue anticoagulation with Eliquis

## 2022-11-08 NOTE — Assessment & Plan Note (Signed)
.   Resume home regimen of Synthroid 

## 2022-11-08 NOTE — Discharge Instructions (Signed)
Patient is DNR Patient has a wound on the second toe of the left foot, please provide the following wound care: Vashe wound cleanser apply a small piece of Vashe moistened gauze to wound bed twice daily, cover with dry gauze and secure with foam or Kerlix roll gauze.  Patient has been prescribed 40 days of oral Doxycycline and Augmentin for osteomyelitis of the second toe of the left foot. Please ensure that the patient follows up with Dr. Victorino Dike with orthopedic surgery in 1 to 2 weeks as well as with [facility] to establish diabetic footcare and management of his toenails. Patient is to be brought back to the emergency department if he develops worsening pain, swelling, fevers, confusion.

## 2022-11-08 NOTE — Assessment & Plan Note (Signed)
Continue metoprolol and diltiazem. 

## 2022-11-08 NOTE — Discharge Summary (Incomplete Revision)
Physician Discharge Summary   Patient: Gerald [name] MRN: [id number] DOB: 1925/02/21  Admit date:     11/06/2022  Discharge date: 11/08/22  Discharge Physician: Marinda Elk   PCP: Pcp, No   Recommendations at discharge:    Patient is DNR Patient has a wound on the second toe of the left foot, please provide the following wound care: Vashe wound cleanser apply a small piece of Vashe moistened gauze to wound bed twice daily, cover with dry gauze and secure with foam or Kerlix roll gauze.  Patient has been prescribed 40 days of oral Doxycycline and Augmentin for osteomyelitis of the second toe of the left foot. Please ensure that the patient follows up with Dr. Victorino Dike with orthopedic surgery in 1 to 2 weeks as well as with [facility] to establish diabetic footcare and management of his toenails. Patient is to be brought back to the emergency department if he develops worsening pain, swelling, fevers, confusion.    Discharge Diagnoses: Principal Problem:   Cellulitis and abscess of toe of left foot Active Problems:   Osteomyelitis of second toe of left foot (HCC)   Diabetic infection of left foot (HCC)   Paroxysmal atrial fibrillation (HCC)   Essential hypertension   GERD without esophagitis   Type 2 diabetes mellitus with complication, with long-term current use of insulin (HCC)   Mixed diabetic hyperlipidemia associated with type 2 diabetes mellitus (HCC)   Hypothyroidism   Hyperlipidemia   Hearing loss of both ears   Diabetes mellitus without complication (HCC)  Resolved Problems:   * No resolved hospital problems. *   Hospital Course: 89 y.o. male with medical history significant of insulin-dependent diabetes mellitus type 2, essential hypertension, bilateral hearing loss, GERD, macular degeneration, hypothyroidism, hyperlipidemia paroxysmal atrial fibrillation who presents from skilled nursing facility with left foot swelling and redness.   In the  emergency department patient was diagnosed with diabetic foot infection with ulceration of the second toe of the left foot and surrounding cellulitis.  Patient was initiated on intravenous antibiotics.  The hospitalist group was then called to assess the patient for admission to the hospital.  Patient was admitted to hospital service and initiated on appropriate intravenous antibiotics for potential osteomyelitis with vancomycin, Flagyl and ceftriaxone.  MRI of the left foot was obtained revealing osteomyelitis of the second toe of the left foot.  Case was discussed with Dr. Victorino Dike with orthopedic surgery.  Dr. Victorino Dike stated that in the absence of sepsis patient would best be served by an outpatient operative intervention as opposed to urgent inpatient intervention.  Patient was treated with intravenous antibiotics with improvement in the patient's left foot swelling redness and pain.  Patient was eventually transition to oral doxycycline and Augmentin to complete a total of 6 weeks of antibiotic therapy.  Patient was discharged back to his facility in stable and improved condition with instructions to follow-up with Dr. [facility]ino Dike with [facility] in 1 to 2 weeks in the outpatient setting as well as with [facility] for continual footcare and management of his toenails.    Consultants: Case and images reviewed with Dr. [facility]ino Dike with [facility]. Procedures performed: None  Disposition: Skilled nursing facility Diet recommendation:  Discharge Diet Orders (From admission, onward)     Start     Ordered   11/08/22 0000  Diet - low sodium heart healthy        11/08/22 0717   11/08/22 0000  Diet Carb Modified        11/08/22 0717           Cardiac and Carb modified diet  DISCHARGE MEDICATION: Allergies as of 11/08/2022   No Known Allergies      Medication List     TAKE these medications    amoxicillin-clavulanate 875-125 MG tablet Commonly  known as: AUGMENTIN Take 1 tablet by mouth every 12 (twelve) hours.   Basaglar KwikPen 100 UNIT/ML Inject 10 Units into the skin at bedtime.   diltiazem 120 MG 24 hr capsule Commonly known as: CARDIZEM CD Take 1 capsule (120 mg total) by mouth daily.   doxycycline 100 MG tablet Commonly known as: VIBRA-TABS Take 1 tablet (100 mg total) by mouth every 12 (twelve) hours.   Eliquis 2.5 MG Tabs tablet Generic drug: apixaban Take 1 tablet (2.5 mg total) by mouth 2 (two) times daily. Start on 12/31/18   FeroSul 325 (65 Fe) MG tablet Generic drug: ferrous sulfate Take 325 mg by mouth daily.   Insulin Pen Needle 31G X 5 MM Misc Commonly known as: B-D UF III MINI PEN NEEDLES USE TO TEST BLOOD SUGARS ICD 10 E11 59   levothyroxine 50 MCG tablet Commonly known as: SYNTHROID Take 50 mcg by mouth every morning.   lisinopril 5 MG tablet Commonly known as: ZESTRIL Take 5 mg by mouth daily.   metoprolol succinate 25 MG 24 hr tablet Commonly known as: TOPROL-XL Take 25 mg by mouth daily.   ONE TOUCH ULTRA 2 w/Device Kit Test blood sugar daily. Dx Code: 250.72   ONE TOUCH ULTRA TEST test strip Generic drug: glucose blood CHECK BLOOD GLUCOSE (SUGAR) DAILY   OneTouch Delica Lancets 33G Misc CHECK BLOOD GLUCOSE DAILY E11  59   pantoprazole 40 MG tablet Commonly known as: PROTONIX Take 1 tablet (40 mg total) by mouth 2 (two) times daily.   pravastatin 10 MG tablet Commonly known as: PRAVACHOL Take 10 mg by mouth daily.   sertraline 50 MG tablet Commonly known as: ZOLOFT Take 50 mg by mouth daily.               Discharge Care Instructions  (From admission, onward)           Start     Ordered   11/08/22 0000  Discharge wound care:       Comments: Vashe wound cleanser [facility] [place] [phone]), apply a small piece of Vashe moistened gauze to wound bed twice daily, cover with dry gauze and secure with foam or Kerlix roll gauze.   11/08/22 9147            Follow-up  Information     Toni Arthurs, MD. Schedule an appointment as soon as possible for a visit in 1 week(s).   Specialty: Orthopedic Surgery Why: Make appointment in 1-2 weeks with Dr. Victorino Dike or one of his associates Contact information: [address] [place] [address] [phone]         [facility]. Schedule an appointment as soon as possible for a visit.   Contact information: [address] [place] [phone]                Discharge Exam: Ceasar Mons Weights   11/06/22 2328  Weight: 64.6 kg    Constitutional: Awake alert and oriented, extremely hard of hearing, no associated distress.   Respiratory: clear to auscultation bilaterally, no wheezing, no crackles. Normal respiratory effort. No accessory muscle use.  Cardiovascular: Regular  rate and rhythm, no murmurs / rubs / gallops. No extremity edema. 2+ pedal pulses. No carotid bruits.  Abdomen: Abdomen is soft and nontender.  No evidence of intra-abdominal masses.  Positive bowel sounds noted in all quadrants.   Musculoskeletal: Dressing of left foot is clean dry and intact.  Good ROM, no contractures. Normal muscle tone.     Condition at discharge: fair  The results of significant diagnostics from this hospitalization (including imaging, microbiology, ancillary and laboratory) are listed below for reference.   Imaging Studies: MR FOOT LEFT W WO CONTRAST  Result Date: 11/07/2022 CLINICAL DATA:  Left forefoot infection.  Second toe ulcer. EXAM: MRI OF THE LEFT FOREFOOT WITHOUT AND WITH CONTRAST TECHNIQUE: Multiplanar, multisequence MR imaging of the left forefoot was performed both before and after administration of intravenous contrast. CONTRAST:  6mL GADAVIST GADOBUTROL 1 MMOL/ML IV SOLN COMPARISON:  Left foot x-rays from yesterday. FINDINGS: Bones/Joint/Cartilage Abnormal marrow edema and patchy enhancement involving the second middle and distal phalanges (series 7, images 17 and  18). No fracture or dislocation. Mild first MTP joint degenerative changes. No joint effusion. Ligaments Collateral ligaments are intact.  Lisfranc ligament is intact. Muscles and Tendons Flexor and extensor tendons are intact. No tenosynovitis. Increased T2 signal within and atrophy of the intrinsic muscles of the forefoot, nonspecific, but likely related to diabetic muscle changes. Soft tissue Superficial ulceration along the dorsal medial aspect of the second toe. Diffuse soft tissue swelling and enhancement of the second toe extending into the dorsal forefoot. No fluid collection. No soft tissue mass. IMPRESSION: 1. Superficial ulceration along the dorsal medial aspect of the second toe with underlying osteomyelitis of the second middle and distal phalanges. 2. Cellulitis of the second toe extending into the dorsal forefoot. No abscess. Electronically Signed   By: Obie Dredge M.D.   On: 11/07/2022 11:02   DG Foot Complete Left  Result Date: 11/06/2022 CLINICAL DATA:  2nd toe ulcer. EXAM: LEFT FOOT - COMPLETE 3+ VIEW COMPARISON:  None Available. FINDINGS: There is soft tissue ulceration of the distal aspect of the second toe. There is no foreign body or cortical erosion. No acute fracture or dislocation. Peripheral vascular calcifications are present. IMPRESSION: Soft tissue ulceration of the distal aspect of the second toe. No radiographic evidence of osteomyelitis. Electronically Signed   By: Darliss Cheney M.D.   On: 11/06/2022 19:48    Microbiology: Results for orders placed or performed during the hospital encounter of 11/06/22  Culture, blood (routine x 2)     Status: None (Preliminary result)   Collection Time: 11/06/22  7:00 PM   Specimen: BLOOD  Result Value Ref Range Status   Specimen Description   Final    BLOOD ARTERY Performed at [facility], [address] [place], [place] [address]    Special Requests   Final    BOTTLES DRAWN AEROBIC AND ANAEROBIC Blood  Culture results may not be optimal due to an excessive volume of blood received in culture bottles Performed at [facility], [address] [place], [place] [address]    Culture  Setup Time   Final    GRAM POSITIVE COCCI BOTTLES DRAWN AEROBIC ONLY CRITICAL RESULT CALLED TO, READ BACK BY AND VERIFIED WITH: Spero Curb [id number] @ 2301 FH Performed at [facility], [address], [place], [place] [address]    Culture [facility] POSITIVE COCCI  Final   Report Status PENDING  Incomplete  Blood Culture ID Panel (  Reflexed)     Status: Abnormal   Collection Time: 11/06/22  7:00 PM  Result Value Ref Range Status   Enterococcus faecalis NOT DETECTED NOT DETECTED Final   Enterococcus Faecium NOT DETECTED NOT DETECTED Final   Listeria monocytogenes NOT DETECTED NOT DETECTED Final   Staphylococcus species DETECTED (A) NOT DETECTED Final    Comment: CRITICAL RESULT CALLED TO, READ BACK BY AND VERIFIED WITH: PHARMD EJean Rosenthal [phone] @ 2301 FH    Staphylococcus aureus (BCID) NOT DETECTED NOT DETECTED Final   Staphylococcus epidermidis NOT DETECTED NOT DETECTED Final   Staphylococcus lugdunensis NOT DETECTED NOT DETECTED Final   Streptococcus species NOT DETECTED NOT DETECTED Final   Streptococcus agalactiae NOT DETECTED NOT DETECTED Final   Streptococcus pneumoniae NOT DETECTED NOT DETECTED Final   Streptococcus pyogenes NOT DETECTED NOT DETECTED Final   A.calcoaceticus-baumannii NOT DETECTED NOT DETECTED Final   Bacteroides fragilis NOT DETECTED NOT DETECTED Final   Enterobacterales NOT DETECTED NOT DETECTED Final   Enterobacter cloacae complex NOT DETECTED NOT DETECTED Final   Escherichia coli NOT DETECTED NOT DETECTED Final   Klebsiella aerogenes NOT DETECTED NOT DETECTED Final   Klebsiella oxytoca NOT DETECTED NOT DETECTED Final   Klebsiella pneumoniae NOT DETECTED NOT DETECTED Final   Proteus species NOT DETECTED NOT DETECTED Final   Salmonella species NOT DETECTED  NOT DETECTED Final   Serratia marcescens NOT DETECTED NOT DETECTED Final   Haemophilus influenzae NOT DETECTED NOT DETECTED Final   Neisseria meningitidis NOT DETECTED NOT DETECTED Final   Pseudomonas aeruginosa NOT DETECTED NOT DETECTED Final   Stenotrophomonas maltophilia NOT DETECTED NOT DETECTED Final   Candida albicans NOT DETECTED NOT DETECTED Final   Candida auris NOT DETECTED NOT DETECTED Final   Candida glabrata NOT DETECTED NOT DETECTED Final   Candida krusei NOT DETECTED NOT DETECTED Final   Candida parapsilosis NOT DETECTED NOT DETECTED Final   Candida tropicalis NOT DETECTED NOT DETECTED Final   Cryptococcus neoformans/gattii NOT DETECTED NOT DETECTED Final    Comment: Performed at [facility], [address] [place], [place] [address]    Labs: CBC: Recent Labs  Lab 11/06/22 1924 11/07/22 0056  WBC 5.2 4.9  NEUTROABS 3.6  --   HGB 10.2* 10.0*  HCT 33.4* 33.1*  MCV 88.6 89.5  PLT 223 220   Basic Metabolic Panel: Recent Labs  Lab 11/06/22 1924 11/07/22 0056  NA 137 134*  K 4.5 3.8  CL 103 102  CO2 25 23  GLUCOSE 121* 105*  BUN 35* 31*  CREATININE 1.43* 1.35*  CALCIUM 8.1* 7.6*   Liver Function Tests: Recent Labs  Lab 11/06/22 1924 11/07/22 0056  AST 15 17  ALT 13 12  ALKPHOS 51 49  BILITOT 0.4 0.3  PROT 7.1 6.9  ALBUMIN 3.4* 3.3*   CBG: Recent Labs  Lab 11/07/22 0046 11/07/22 0747 11/07/22 1419 11/07/22 1759 11/07/22 2121  GLUCAP 94 80 90 85 145*    Discharge time spent: greater than 30 minutes.  Signed: Marinda Elk, MD [facility] 11/08/2022

## 2022-11-08 NOTE — Assessment & Plan Note (Signed)
   Continue home regimen of statin therapy 

## 2022-11-08 NOTE — Progress Notes (Signed)
PROGRESS NOTE   Gerald [name]  WUJ:[id number] DOB: October 06, 1925 DOA: 11/06/2022 PCP: Pcp, No   Date of Service: the patient was seen and examined on 11/08/2022  Brief Narrative:  89 y.o. male with medical history significant of insulin-dependent diabetes mellitus type 2, essential hypertension, bilateral hearing loss, GERD, macular degeneration, hypothyroidism, hyperlipidemia paroxysmal atrial fibrillation who presents from skilled nursing facility with left foot swelling and redness.   In the emergency department patient was diagnosed with diabetic foot infection with ulceration of the second toe of the left foot and surrounding cellulitis.  Patient was initiated on intravenous antibiotics.  The hospitalist group was then called to assess the patient for admission to the hospital.  Patient was admitted to hospital service and initiated on appropriate intravenous antibiotics for potential osteomyelitis with vancomycin, Flagyl and ceftriaxone.  MRI of the left foot was obtained revealing osteomyelitis of the second toe of the left foot.  Case was discussed with Dr. Victorino Dike with orthopedic surgery.  Dr. Victorino Dike stated that in the absence of sepsis patient would best be served by an outpatient operative intervention as opposed to urgent inpatient intervention.    Assessment and Plan: Osteomyelitis of second toe of left foot (HCC) MRI evidence of osteomyelitis Case discussed with Dr. Victorino Dike with orthopedic surgery on 10/18.  Considering the clinical lack of sepsis this would be best addressed in the outpatient setting.  Dr. Victorino Dike recommends discharge on appropriate course of oral antibiotic therapy once clinically stable with eventual plan for outpatient orthopedic follow-up in 1 to 2 weeks to determine potential surgical intervention. Case discussed with granddaughter who is POA who agrees with plan. Attempting to transition patient to oral antibiotics today  Diabetic infection of left foot  (HCC) Antibiotics as above Wound care consultation placed, dressing changes daily. Wound was likely started by poor care of patient's overgrown toenails followed by poor wound healing due to diabetes mellitus.   Patient will need to follow-up with podiatry with [facility] after discharge.  Paroxysmal atrial fibrillation (HCC) Continue metoprolol, diltiazem Continue anticoagulation with Eliquis  Essential hypertension Continue metoprolol and diltiazem  GERD without esophagitis Continue Protonix  Type 2 diabetes mellitus with complication, with long-term current use of insulin (HCC) Patient been placed on Accu-Cheks before every meal and nightly with sliding scale insulin Continuing home regimen of basal insulin therapy. Hemoglobin A1C 6.1% Diabetic Diet   Mixed diabetic hyperlipidemia associated with type 2 diabetes mellitus (HCC) Continue home regimen of statin therapy  Hypothyroidism Resume home regimen of Synthroid     Subjective:  Patient states his left foot pain is improved since yesterday.  Pain is mild to moderate in intensity, nonradiating and associated with redness and swelling.  Physical Exam:  Vitals:   11/07/22 0431 11/07/22 1449    BP: (!) 106/50 118/62    Pulse: 75 75    Resp: 18 18    Temp: 98.1 F (36.7 C) 97.7 F (36.5 C)    TempSrc: Oral Oral    SpO2: 100% 96%    Weight:      Height:        Constitutional: Awake and alert, profoundly hard of hearing.  No associated distress. Skin: Notable 1 cm ulceration on the medial surface of the left second toe with surrounding redness induration and mild warmth.  Improved since yesterday.  Poor skin turgor. Eyes: Pupils are equally reactive to light.  No evidence of scleral icterus or conjunctival pallor.  ENMT: Dry mucous membranes noted.  Posterior pharynx clear of any exudate or lesions.   Respiratory: clear to auscultation bilaterally, no wheezing, no crackles. Normal respiratory effort. No  accessory muscle use.  Cardiovascular: Irregularly irregular rate and rhythm.  No murmurs / rubs / gallops. No extremity edema. 2+ pedal pulses. No carotid bruits.  Abdomen: Abdomen is soft and nontender.  No evidence of intra-abdominal masses.  Positive bowel sounds noted in all quadrants.   Musculoskeletal: No joint deformity upper and lower extremities. Good ROM, no contractures. Normal muscle tone.    Data Reviewed:  I have personally reviewed and interpreted labs, imaging.  Significant findings are   CBC: Recent Labs  Lab 11/06/22 1924 11/07/22 0056 11/08/22 0706  WBC 5.2 4.9 5.2  NEUTROABS 3.6  --  3.5  HGB 10.2* 10.0* 10.4*  HCT 33.4* 33.1* 33.6*  MCV 88.6 89.5 86.8  PLT 223 220 223   Basic Metabolic Panel: Recent Labs  Lab 11/06/22 1924 11/07/22 0056 11/08/22 0706  NA 137 134* 135  K 4.5 3.8 4.3  CL 103 102 101  CO2 25 23 25   GLUCOSE 121* 105* 93  BUN 35* 31* 22  CREATININE 1.43* 1.35* 1.19  CALCIUM 8.1* 7.6* 8.0*  MG  --   --  2.2   GFR: Estimated Creatinine Clearance: 33.5 mL/min (by C-G formula based on SCr of 1.19 mg/dL). Liver Function Tests: Recent Labs  Lab 11/06/22 1924 11/07/22 0056 11/08/22 0706  AST 15 17 16   ALT 13 12 11   ALKPHOS 51 49 51  BILITOT 0.4 0.3 0.5  PROT 7.1 6.9 6.5  ALBUMIN 3.4* 3.3* 3.0*      Code Status:  DNR.  Code status decision has been confirmed with: granddaughter Family Communication: Plan of care discussed with granddaughter on 11/07/2022.   Severity of Illness:  The appropriate patient status for this patient is INPATIENT. Inpatient status is judged to be reasonable and necessary in order to provide the required intensity of service to ensure the patient's safety. The patient's presenting symptoms, physical exam findings, and initial radiographic and laboratory data in the context of their chronic comorbidities is felt to place them at high risk for further clinical deterioration. Furthermore, it is not  anticipated that the patient will be medically stable for discharge from the hospital within 2 midnights of admission.   * I certify that at the point of admission it is my clinical judgment that the patient will require inpatient hospital care spanning beyond 2 midnights from the point of admission due to high intensity of service, high risk for further deterioration and high frequency of surveillance required.*  Time spent:  38 minutes  Author:  Marinda Elk MD  11/08/2022

## 2022-11-08 NOTE — Plan of Care (Signed)
  Problem: Education: Goal: Knowledge of General Education information will improve Description: Including pain rating scale, medication(s)/side effects and non-pharmacologic comfort measures Outcome: Progressing   Problem: Health Behavior/Discharge Planning: Goal: Ability to manage health-related needs will improve Outcome: Progressing   Problem: Nutrition: Goal: Adequate nutrition will be maintained Outcome: Progressing   Problem: Pain Managment: Goal: General experience of comfort will improve Outcome: Progressing   Problem: Safety: Goal: Ability to remain free from injury will improve Outcome: Progressing   

## 2022-11-08 NOTE — Assessment & Plan Note (Addendum)
Antibiotics as above Wound care consultation placed, dressing changes daily. Wound was likely started by poor care of patient's overgrown toenails followed by poor wound healing due to diabetes mellitus.   Patient will need to follow-up with podiatry with [facility] after discharge.

## 2022-11-08 NOTE — Assessment & Plan Note (Addendum)
MRI evidence of osteomyelitis Case discussed with Dr. Victorino Dike with orthopedic surgery on 10/18.  Considering the clinical lack of sepsis this would be best addressed in the outpatient setting.  Dr. Victorino Dike recommends discharge on appropriate course of oral antibiotic therapy once clinically stable with eventual plan for outpatient orthopedic follow-up in 1 to 2 weeks to determine potential surgical intervention. Case discussed with granddaughter who is POA who agrees with plan. Attempting to transition patient to oral antibiotics today

## 2022-11-08 NOTE — Assessment & Plan Note (Signed)
Continue Protonix °

## 2022-11-08 NOTE — Hospital Course (Addendum)
89 y.o. male with medical history significant of insulin-dependent diabetes mellitus type 2, essential hypertension, bilateral hearing loss, GERD, macular degeneration, hypothyroidism, hyperlipidemia paroxysmal atrial fibrillation who presents from skilled nursing facility with left foot swelling and redness.   In the emergency department patient was diagnosed with infected ulceration of the second toe of the left foot and surrounding cellulitis.   Patient was initiated on intravenous antibiotics.  The hospitalist group was then called to assess the patient for admission to the hospital.  Patient was admitted to hospital service and initiated on appropriate intravenous antibiotics for potential osteomyelitis with vancomycin, Flagyl and ceftriaxone.  MRI of the left foot was obtained revealing osteomyelitis of the second toe of the left foot.  The onset of the ulceration was felt to be secondary to patient's poor maintenance of his toenails which are markedly overgrown with the toenail of the great toe of the left foot chafing against the second toe.  This propagated as a diabetic foot infection and eventually developed osteomyelitis.  The case was discussed with Dr. Victorino Dike with orthopedic surgery.  Dr. Victorino Dike stated that in the absence of sepsis patient would best be served by an outpatient operative intervention as opposed to urgent inpatient intervention.  Patient was treated with intravenous antibiotics with improvement in the patient's left foot swelling redness and pain.  Patient was eventually transition to oral doxycycline and Augmentin to complete a total of 6 weeks of antibiotic therapy.  Patient was additionally prescribed a 6-month course of terbinafine for treatment of suspected onychomycosis of the toenails of the left foot.  At time of discharge, the patient is receiving facilities being instructed to check his liver enzymes approximately 4 weeks after initiation of therapy to ensure there is no  evidence of liver toxicity.    Patient was discharged back to his facility in stable and improved condition with instructions to follow-up with Dr. Victorino Dike with [facility] in 1 to 2 weeks in the outpatient setting as well as with [facility] for continual footcare and management of his toenails.

## 2022-11-09 DIAGNOSIS — L03032 Cellulitis of left toe: Secondary | ICD-10-CM | POA: Diagnosis not present

## 2022-11-09 DIAGNOSIS — E11628 Type 2 diabetes mellitus with other skin complications: Secondary | ICD-10-CM | POA: Diagnosis not present

## 2022-11-09 DIAGNOSIS — H903 Sensorineural hearing loss, bilateral: Secondary | ICD-10-CM

## 2022-11-09 DIAGNOSIS — E039 Hypothyroidism, unspecified: Secondary | ICD-10-CM

## 2022-11-09 DIAGNOSIS — B351 Tinea unguium: Secondary | ICD-10-CM | POA: Diagnosis present

## 2022-11-09 DIAGNOSIS — I480 Paroxysmal atrial fibrillation: Secondary | ICD-10-CM | POA: Diagnosis not present

## 2022-11-09 DIAGNOSIS — M869 Osteomyelitis, unspecified: Secondary | ICD-10-CM | POA: Diagnosis not present

## 2022-11-09 LAB — GLUCOSE, CAPILLARY
Glucose-Capillary: 83 mg/dL (ref 70–99)
Glucose-Capillary: 137 mg/dL — ABNORMAL HIGH (ref 70–99)

## 2022-11-09 LAB — COMPREHENSIVE METABOLIC PANEL
ALT: 9 U/L (ref 0–44)
AST: 18 U/L (ref 15–41)
Albumin: 2.9 g/dL — ABNORMAL LOW (ref 3.5–5.0)
Alkaline Phosphatase: 46 U/L (ref 38–126)
Anion gap: 8 (ref 5–15)
BUN: 18 mg/dL (ref 8–23)
CO2: 22 mmol/L (ref 22–32)
Calcium: 7.9 mg/dL — ABNORMAL LOW (ref 8.9–10.3)
Chloride: 104 mmol/L (ref 98–111)
Creatinine, Ser: 1.18 mg/dL (ref 0.61–1.24)
GFR, Estimated: 57 mL/min — ABNORMAL LOW (ref >60)
Glucose, Bld: 85 mg/dL (ref 70–99)
Potassium: 4.1 mmol/L (ref 3.5–5.1)
Sodium: 134 mmol/L — ABNORMAL LOW (ref 135–145)
Total Bilirubin: 0.6 mg/dL (ref 0.3–1.2)
Total Protein: 6.5 g/dL (ref 6.5–8.1)

## 2022-11-09 LAB — CBC WITH DIFFERENTIAL/PLATELET
Abs Immature Granulocytes: 0.03 10*3/uL (ref 0.00–0.07)
Basophils Absolute: 0 10*3/uL (ref 0.0–0.1)
Basophils Relative: 1 %
Eosinophils Absolute: 0.1 10*3/uL (ref 0.0–0.5)
Eosinophils Relative: 2 %
HCT: 34.7 % — ABNORMAL LOW (ref 39.0–52.0)
Hemoglobin: 10.5 g/dL — ABNORMAL LOW (ref 13.0–17.0)
Immature Granulocytes: 1 %
Lymphocytes Relative: 19 %
Lymphs Abs: 1 10*3/uL (ref 0.7–4.0)
MCH: 27 pg (ref 26.0–34.0)
MCHC: 30.3 g/dL (ref 30.0–36.0)
MCV: 89.2 fL (ref 80.0–100.0)
Monocytes Absolute: 0.5 10*3/uL (ref 0.1–1.0)
Monocytes Relative: 10 %
Neutro Abs: 3.5 10*3/uL (ref 1.7–7.7)
Neutrophils Relative %: 67 %
Platelets: 240 10*3/uL (ref 150–400)
RBC: 3.89 MIL/uL — ABNORMAL LOW (ref 4.22–5.81)
RDW: 15.2 % (ref 11.5–15.5)
WBC: 5.2 10*3/uL (ref 4.0–10.5)
nRBC: 0 % (ref 0.0–0.2)

## 2022-11-09 LAB — MAGNESIUM: Magnesium: 2.2 mg/dL (ref 1.7–2.4)

## 2022-11-09 LAB — CULTURE, BLOOD (ROUTINE X 2)

## 2022-11-09 MED ORDER — DOXYCYCLINE HYCLATE 100 MG PO TABS: 100.0000 mg | ORAL_TABLET | Freq: Two times a day (BID) | ORAL | 1 refills | Status: DC

## 2022-11-09 MED ORDER — TERBINAFINE HCL 250 MG PO TABS: 250.0000 mg | ORAL_TABLET | Freq: Every day | ORAL | 5 refills | Status: DC

## 2022-11-09 MED ORDER — AMOXICILLIN-POT CLAVULANATE 875-125 MG PO TABS: 1.0000 | ORAL_TABLET | Freq: Two times a day (BID) | ORAL | 1 refills | Status: AC

## 2022-11-09 MED ORDER — TERBINAFINE HCL 250 MG PO TABS: 250.0000 mg | ORAL_TABLET | Freq: Every day | ORAL | Status: DC

## 2022-11-09 NOTE — Plan of Care (Signed)

## 2022-11-09 NOTE — TOC Transition Note (Signed)
Transition of Care [facility]) - CM/SW Discharge Note   Patient Details  Name: Gerald [name] MRN: [id number] Date of Birth: 07-27-1925  Transition of Care [facility]) CM/SW Contact:  Georgie Chard, LCSW Phone Number: 11/09/2022, 11:55 AM   Clinical Narrative:    CSW spoke to the patient granddaughter at this time patient granddaughter can not transport the patient due to a recent medical issue of her own. This CSW has reached out to [facility] to make sure that the patient is able to return. The patient is able to return and will transport by [facility]. This CSW will arrange transport.         Patient Goals and CMS Choice      Discharge Placement                         Discharge Plan and Services Additional resources added to the After Visit Summary for                                       Social Determinants of Health (SDOH) Interventions SDOH Screenings   Food Insecurity: No Food Insecurity (11/07/2022)  Housing: Low Risk  (11/07/2022)  Transportation Needs: No Transportation Needs (11/07/2022)  Utilities: Not At Risk (11/07/2022)  Tobacco Use: Medium Risk (11/07/2022)     Readmission Risk Interventions     No data to display

## 2022-11-12 LAB — CULTURE, BLOOD (ROUTINE X 2)
Culture: NO GROWTH
Special Requests: ADEQUATE

## 2022-11-17 ENCOUNTER — Ambulatory Visit: Payer: Medicare Other | Admitting: Podiatry

## 2022-12-03 ENCOUNTER — Ambulatory Visit (INDEPENDENT_AMBULATORY_CARE_PROVIDER_SITE_OTHER): Payer: Medicare Other | Admitting: Nurse Practitioner

## 2022-12-03 ENCOUNTER — Encounter: Payer: Self-pay | Admitting: Nurse Practitioner

## 2022-12-03 VITALS — BP 127/88 | HR 57 | Temp 97.1°F | Resp 18 | Ht 66.0 in | Wt 146.0 lb

## 2022-12-03 DIAGNOSIS — B351 Tinea unguium: Secondary | ICD-10-CM

## 2022-12-03 DIAGNOSIS — H3530 Unspecified macular degeneration: Secondary | ICD-10-CM

## 2022-12-03 DIAGNOSIS — E039 Hypothyroidism, unspecified: Secondary | ICD-10-CM

## 2022-12-03 DIAGNOSIS — K219 Gastro-esophageal reflux disease without esophagitis: Secondary | ICD-10-CM

## 2022-12-03 DIAGNOSIS — R197 Diarrhea, unspecified: Secondary | ICD-10-CM

## 2022-12-03 DIAGNOSIS — Z794 Long term (current) use of insulin: Secondary | ICD-10-CM

## 2022-12-03 DIAGNOSIS — R4189 Other symptoms and signs involving cognitive functions and awareness: Secondary | ICD-10-CM

## 2022-12-03 DIAGNOSIS — Z66 Do not resuscitate: Secondary | ICD-10-CM

## 2022-12-03 DIAGNOSIS — I480 Paroxysmal atrial fibrillation: Secondary | ICD-10-CM

## 2022-12-03 DIAGNOSIS — I10 Essential (primary) hypertension: Secondary | ICD-10-CM

## 2022-12-03 DIAGNOSIS — H6123 Impacted cerumen, bilateral: Secondary | ICD-10-CM

## 2022-12-03 DIAGNOSIS — E785 Hyperlipidemia, unspecified: Secondary | ICD-10-CM

## 2022-12-03 DIAGNOSIS — E118 Type 2 diabetes mellitus with unspecified complications: Secondary | ICD-10-CM

## 2022-12-03 DIAGNOSIS — M869 Osteomyelitis, unspecified: Secondary | ICD-10-CM

## 2022-12-03 NOTE — Patient Instructions (Addendum)
To add probiotic daily to help with diarrhea.   Needs 24 hour care- recommend patient to have assistance with bathing and changing his clothes.   Needs to have bath 3 times weekly and clothes washed.

## 2022-12-03 NOTE — Progress Notes (Addendum)
Careteam: Patient Care Team: [name], NP as PCP - General (Geriatric Medicine)  PLACE OF SERVICE:  [facility]  Advanced Directive information Does Patient Have a Medical Advance Directive?: Yes, Type of Advance Directive: Living will;Healthcare Power of [place];Out of facility DNR (pink MOST or yellow form), Does patient want to make changes to medical advance directive?: No - Patient declined  No Known Allergies  Chief Complaint  Patient presents with   Establish Care    New Patient     HPI: Patient is a 89 y.o. male to establish care.    He lives at [facility], lives alone Granddaughter is POA- not at appt today.   He is very [facility]- nurse on phone to help with visit today.   On Augmentin every 12 hours due to toe infection- he is seeing podiatry Was hospitalized due to osteomyelitis of the toe.  He is refusing PM antibiotic due to diarrhea.  Continues on lamisil 250 mg daily  Also seeing orthopedics for talks about amputation of toe.  He has [facility] and they are coming twice weekly to monitor toe and for dressing changes.   GERD- bad reflux- using pepto bismol a lot and using protonix twice daily  Htn- on diltiazem 120 mg daily, metoprolol 25 mg daily, lisinopril 5 mg daily   Unsure why he is taking eliquis 2.5 mg BID- has been taking since last year- but unsure why- found hx of a fib in chart  Anemia- on iron supplement daily   DM- on basaglar 10 units at bedtime- blood sugars at breakfast ranging from 94-11 at supper 150-198  On synthroid 50 mcg for hypothyroid  Sleeps all day- can not see, can not hear. Expressed that he is ready to go "home" to staff.  As alarm that goes off for meals and he will get out of bed for this  On zoloft 50 mg daily   On pravastatin for elevated cholesterol.   Previous provider recommended hospice but granddaughter declined.  His clothes are torn today and jacket is dirty. Clothes (and  shoes) smell of urine.  Soiled underwear Does not bath per facility and granddaughter does not pay for assistance for ADL Needs assistance with ADLs but does not have an aide or help for this.  He does get assistance going to dining and with medication management Using the pull cord to notify when he is ready to eat- which is not the appropriate use of the pull cord system per staff.  Was a no show to his podiatry appt, on 28th but went to orthopedic because facility took him to his appt.  Family was not taking him to any appts so now facility is taking him.   Review of Systems:  Review of Systems  Constitutional:  Negative for chills, fever and weight loss.  HENT:  Positive for hearing loss. Negative for tinnitus.   Respiratory:  Negative for cough, sputum production and shortness of breath.   Cardiovascular:  Negative for chest pain, palpitations and leg swelling.  Gastrointestinal:  Positive for heartburn. Negative for abdominal pain, constipation and diarrhea.  Genitourinary:  Negative for dysuria, frequency and urgency.  Musculoskeletal:  Positive for joint pain. Negative for back pain, falls and myalgias.  Skin: Negative.   Neurological:  Negative for dizziness and headaches.  Psychiatric/Behavioral:  Positive for depression and memory loss. The patient does not have insomnia.     Past Medical History:  Diagnosis Date   Anemia  Diabetes mellitus    GI problem    Hard of hearing    Hypertension    Macular degeneration    Dr Ashley Royalty   Open wound of left foot excluding one or more toes    Stricture    PMH OF   Thyroid condition    Past Surgical History:  Procedure Laterality Date   CATARACT EXTRACTION     OD   COLONOSCOPY W/ POLYPECTOMY  2009   CORONARY ARTERY BYPASS GRAFT     ESOPHAGEAL DILATION     x 1   ESOPHAGOGASTRODUODENOSCOPY (EGD) WITH PROPOFOL N/A 12/28/2018   Procedure: ESOPHAGOGASTRODUODENOSCOPY (EGD) WITH PROPOFOL;  Surgeon: Lemar Lofty., MD;   Location: [facility];  Service: Gastroenterology;  Laterality: N/A;   FOREIGN BODY REMOVAL  12/28/2018   Procedure: FOREIGN BODY REMOVAL;  Surgeon: Meridee Score Netty Starring., MD;  Location: [facility];  Service: Gastroenterology;;   INGUINAL HERNIA REPAIR  1998   Bilateral   LUMBAR LAMINECTOMY  1996   Macular Degeneration Surgery     Social History:   reports that he quit smoking about 34 years ago. His smoking use included cigarettes. He has never used smokeless tobacco. He reports that he does not drink alcohol and does not use drugs.  Family History  Problem Relation Age of Onset   Hypertension Mother    Coronary artery disease Mother    Stroke Mother 34       ?   Heart attack Mother 55       ?   Arthritis Father    Colon cancer Brother    Diabetes Maternal Grandmother    Diabetes Paternal Grandmother     Medications: Patient's Medications  New Prescriptions   No medications on file  Previous Medications   AMOXICILLIN-CLAVULANATE (AUGMENTIN) 875-125 MG TABLET    Take 1 tablet by mouth every 12 (twelve) hours.   BISMUTH SUBSALICYLATE (PEPTO BISMOL PO)    Take by mouth as needed.   BLOOD GLUCOSE MONITORING SUPPL (ONE TOUCH ULTRA 2) W/DEVICE KIT    Test blood sugar daily. Dx Code: 250.72   DILTIAZEM (CARDIZEM CD) 120 MG 24 HR CAPSULE    Take 1 capsule (120 mg total) by mouth daily.   ELIQUIS 2.5 MG TABS TABLET    Take 1 tablet (2.5 mg total) by mouth 2 (two) times daily. Start on 12/31/18   FEROSUL 325 (65 FE) MG TABLET    Take 325 mg by mouth daily.   INSULIN GLARGINE (BASAGLAR KWIKPEN) 100 UNIT/ML    Inject 10 Units into the skin at bedtime.   INSULIN PEN NEEDLE (B-D UF III MINI PEN NEEDLES) 31G X 5 MM MISC    USE TO TEST BLOOD SUGARS ICD 10 E11 59   LEVOTHYROXINE (SYNTHROID) 50 MCG TABLET    Take 50 mcg by mouth every morning.   LISINOPRIL (ZESTRIL) 5 MG TABLET    Take 5 mg by mouth daily.   MELATONIN 1 MG TABS TABLET    Take 1 mg by mouth at bedtime.   METOPROLOL  SUCCINATE (TOPROL-XL) 25 MG 24 HR TABLET    Take 25 mg by mouth daily.   ONE TOUCH ULTRA TEST TEST STRIP    CHECK BLOOD GLUCOSE (SUGAR) DAILY   ONETOUCH DELICA LANCETS 33G MISC    CHECK BLOOD GLUCOSE DAILY E11  59   PANTOPRAZOLE (PROTONIX) 40 MG TABLET    Take 1 tablet (40 mg total) by mouth 2 (two) times daily.   PRAVASTATIN (PRAVACHOL) 10  MG TABLET    Take 10 mg by mouth daily.   SERTRALINE (ZOLOFT) 50 MG TABLET    Take 50 mg by mouth daily.   TERBINAFINE (LAMISIL) 250 MG TABLET    Take 1 tablet (250 mg total) by mouth daily.  Modified Medications   No medications on file  Discontinued Medications   DOXYCYCLINE (VIBRA-TABS) 100 MG TABLET    Take 1 tablet (100 mg total) by mouth every 12 (twelve) hours.    Physical Exam:  Vitals:   12/03/22 1043  BP: 127/88  Pulse: (!) 57  Resp: 18  Temp: (!) 97.1 F (36.2 C)  SpO2: 97%  Weight: 146 lb (66.2 kg)  Height: 5\' 6"  (1.676 m)   Body mass index is 23.57 kg/m. Wt Readings from Last 3 Encounters:  12/03/22 146 lb (66.2 kg)  11/06/22 142 lb 6.7 oz (64.6 kg)  04/07/19 152 lb (68.9 kg)    Physical Exam Constitutional:      General: He is not in acute distress.    Appearance: He is well-developed. He is not diaphoretic.  HENT:     Head: Normocephalic and atraumatic.     Right Ear: External ear normal. There is impacted cerumen.     Left Ear: External ear normal. There is impacted cerumen.     Mouth/Throat:     Pharynx: No oropharyngeal exudate.  Eyes:     Conjunctiva/sclera: Conjunctivae normal.     Pupils: Pupils are equal, round, and reactive to light.  Cardiovascular:     Rate and Rhythm: Normal rate and regular rhythm.     Heart sounds: Normal heart sounds.  Pulmonary:     Effort: Pulmonary effort is normal.     Breath sounds: Normal breath sounds.  Abdominal:     General: Bowel sounds are normal.     Palpations: Abdomen is soft.  Musculoskeletal:        General: No tenderness.     Cervical back: Normal range of  motion and neck supple.     Right lower leg: No edema.     Left lower leg: No edema.  Skin:    General: Skin is warm and dry.  Neurological:     Mental Status: He is alert and oriented to person, place, and time.     Labs reviewed: Basic Metabolic Panel: Recent Labs    11/07/22 0056 11/08/22 0706 11/09/22 0556  NA 134* 135 134*  K 3.8 4.3 4.1  CL 102 101 104  CO2 23 25 22   GLUCOSE 105* 93 85  BUN 31* 22 18  CREATININE 1.35* 1.19 1.18  CALCIUM 7.6* 8.0* 7.9*  MG  --  2.2 2.2   Liver Function Tests: Recent Labs    11/07/22 0056 11/08/22 0706 11/09/22 0556  AST 17 16 18   ALT 12 11 9   ALKPHOS 49 51 46  BILITOT 0.3 0.5 0.6  PROT 6.9 6.5 6.5  ALBUMIN 3.3* 3.0* 2.9*   No results for input(s): "LIPASE", "AMYLASE" in the last 8760 hours. No results for input(s): "AMMONIA" in the last 8760 hours. CBC: Recent Labs    11/06/22 1924 11/07/22 0056 11/08/22 0706 11/09/22 0556  WBC 5.2 4.9 5.2 5.2  NEUTROABS 3.6  --  3.5 3.5  HGB 10.2* 10.0* 10.4* 10.5*  HCT 33.4* 33.1* 33.6* 34.7*  MCV 88.6 89.5 86.8 89.2  PLT 223 220 223 240   Lipid Panel: No results for input(s): "CHOL", "HDL", "LDLCALC", "TRIG", "CHOLHDL", "LDLDIRECT" in the last 8760 hours. TSH:  No results for input(s): "TSH" in the last 8760 hours. A1C: Lab Results  Component Value Date   HGBA1C 6.1 (H) 11/07/2022     Assessment/Plan 1. Paroxysmal atrial fibrillation (HCC) -rate controlled, continues on metoprolol and diltiazem  No recent cardiology follow up.  Continues on eliquis  Denies chest pains.  2. Osteomyelitis of second toe of left foot (HCC) -he had missed recent podiatry appt but was able to be seen by orthopedics, do not have these records Continues with home health for dressing changes and augmentin BID - AMB Referral [facility] Care Management  3. Acquired hypothyroidism Continues on synthroid  - TSH  4. Macular degeneration of both eyes, unspecified type Significant impairment,  recommend increase in care.   5. GERD without esophagitis -has had more issues, continues on protonix 40 mg BID.  Dietary modifications encouraged.  May need change medications if ongoing problem   6. Essential hypertension -Blood pressure well controlled, goal bp <140/90 Continue current medications and dietary modifications follow metabolic panel - Complete Metabolic Panel with eGFR - CBC with Differential/Platelet  7. Hyperlipidemia, unspecified hyperlipidemia type Continues on pravastatin  8. Diarrhea, unspecified type Due to antibiotics  Add probiotics   9. DNR (do not resuscitate) Per facility  - Do not attempt resuscitation (DNR)  10. Cognitive impairment Clearly has significant memory issues, likely dementia.  He is unkept and unclean today at visit Smells of urine. Has urine on socks and shoes and has stool in underwear.  Clothes are visible soiled.  Needs increase supervision and likely 24 hours care due to severe impairment with vision and hearing and memory deficit. He has missed several appts  - AMB Referral [facility] Care Management for help with placement  Continue supervision through [facility] care team as well, they have made recommendations for family for increase in services.  Social services also notified.   11. Type 2 diabetes mellitus with complication, with long-term current use of insulin (HCC) Stable on current regimen, A1c at goal. No hypoglycemia.  Encouraged dietary compliance, routine foot care/monitoring and to keep up with diabetic eye exams through ophthalmology   12. Onychomycosis On a 6 month treatment of terbinafine- follow up CMP Needs to make appt with podiatrist for ongoing follow up.   13. Cerumen impaction bilaterally -lavage bilaterally completed and pt tolerated with good results.   Return in about 3 months (around 03/05/2023) for routine follow up .  Janene Harvey. Biagio Borg [facility] [phone]    Addendum- patient granddaughter who is POA called- she reports she had attempted to get more care for him but he refuses a lot of the time. POA teresa reports she is willing to move him to a higher level of care.  She reports she is willing to get him more help and assistance.  Unsure who facility has been talking to but will look into increase assistance.  Agreeable for palliative care- referral placed for goals of care and next steps.

## 2022-12-03 NOTE — Addendum Note (Signed)
Addended by: [name] on: 12/03/2022 04:04 PM   Modules accepted: Orders

## 2022-12-04 ENCOUNTER — Telehealth: Payer: Self-pay | Admitting: *Deleted

## 2022-12-04 LAB — CBC WITH DIFFERENTIAL/PLATELET
Absolute Lymphocytes: 1422 {cells}/uL (ref 850–3900)
Absolute Monocytes: 465 {cells}/uL (ref 200–950)
Basophils Absolute: 28 {cells}/uL (ref 0–200)
Basophils Relative: 0.5 %
Eosinophils Absolute: 62 {cells}/uL (ref 15–500)
Eosinophils Relative: 1.1 %
HCT: 33.7 % — ABNORMAL LOW (ref 38.5–50.0)
Hemoglobin: 10.5 g/dL — ABNORMAL LOW (ref 13.2–17.1)
MCH: 27 pg (ref 27.0–33.0)
MCHC: 31.2 g/dL — ABNORMAL LOW (ref 32.0–36.0)
MCV: 86.6 fL (ref 80.0–100.0)
MPV: 9.9 fL (ref 7.5–12.5)
Monocytes Relative: 8.3 %
Neutro Abs: 3623 {cells}/uL (ref 1500–7800)
Neutrophils Relative %: 64.7 %
Platelets: 239 10*3/uL (ref 140–400)
RBC: 3.89 10*6/uL — ABNORMAL LOW (ref 4.20–5.80)
RDW: 15.7 % — ABNORMAL HIGH (ref 11.0–15.0)
Total Lymphocyte: 25.4 %
WBC: 5.6 10*3/uL (ref 3.8–10.8)

## 2022-12-04 LAB — COMPLETE METABOLIC PANEL WITH GFR
AG Ratio: 1.2 (calc) (ref 1.0–2.5)
ALT: 7 U/L — ABNORMAL LOW (ref 9–46)
AST: 12 U/L (ref 10–35)
Albumin: 3.6 g/dL (ref 3.6–5.1)
Alkaline phosphatase (APISO): 53 U/L (ref 35–144)
BUN/Creatinine Ratio: 21 (calc) (ref 6–22)
BUN: 27 mg/dL — ABNORMAL HIGH (ref 7–25)
CO2: 28 mmol/L (ref 20–32)
Calcium: 8.5 mg/dL — ABNORMAL LOW (ref 8.6–10.3)
Chloride: 101 mmol/L (ref 98–110)
Creat: 1.26 mg/dL — ABNORMAL HIGH (ref 0.70–1.22)
Globulin: 2.9 g/dL (ref 1.9–3.7)
Glucose, Bld: 156 mg/dL — ABNORMAL HIGH (ref 65–99)
Potassium: 4.4 mmol/L (ref 3.5–5.3)
Sodium: 136 mmol/L (ref 135–146)
Total Bilirubin: 0.3 mg/dL (ref 0.2–1.2)
Total Protein: 6.5 g/dL (ref 6.1–8.1)
eGFR: 53 mL/min/{1.73_m2} — ABNORMAL LOW (ref >=60)

## 2022-12-04 LAB — TSH: TSH: 2.47 m[IU]/L (ref 0.40–4.50)

## 2022-12-04 NOTE — Progress Notes (Signed)
  Care Coordination   Note   12/04/2022 Name: Gerald [name] MRN: [id number] DOB: 1925-09-30  Gerald [name] is a 89 y.o. year old male who sees Eubanks, Janene Harvey, NP for primary care. I reached out to Rosette Reveal by phone today to offer care coordination services.  [name] was given information about Care Coordination services today including:   The Care Coordination services include support from the care team which includes your Nurse Coordinator, Clinical Social Worker, or Pharmacist.  The Care Coordination team is here to help remove barriers to the health concerns and goals most important to you. Care Coordination services are voluntary, and the patient may decline or stop services at any time by request to their care team member.   Care Coordination Consent Status: Patient granddaughter Gerald [name]  DPR on file agreed to services and verbal consent obtained.   Follow up plan:  Telephone appointment with care coordination team member scheduled for:  11/15  Encounter Outcome:  Patient Scheduled  [facility] Coordination Care Guide  Direct Dial: [phone]

## 2022-12-05 ENCOUNTER — Ambulatory Visit: Payer: Self-pay | Admitting: Licensed Clinical Social Worker

## 2022-12-08 ENCOUNTER — Telehealth: Payer: Self-pay | Admitting: Nurse Practitioner

## 2022-12-08 ENCOUNTER — Telehealth: Payer: Self-pay

## 2022-12-08 NOTE — Telephone Encounter (Signed)
Patient's daughter Rosey Bath) called and wanted to know if the list for skilled nursing facilities were send to her email. She said if you need to reach out to her call [phone].

## 2022-12-08 NOTE — Telephone Encounter (Signed)
Faxed letter to Fredric Mare [phone]) from [place] regarding patient's cognitive issues.

## 2022-12-09 NOTE — Patient Outreach (Signed)
  Care Coordination   Initial Visit Note   12/05/2022 Name: Gerald [name] MRN: [id number] DOB: August 09, 1925  Gerald [name] is a 89 y.o. year old male who sees Eubanks, Janene Harvey, NP for primary care. I spoke with  Gerald [name]'s granddaughter by phone today.  What matters to the patients health and wellness today?  Level of Care    Goals Addressed             This Visit's Progress    Obtain Supportive Resources-Placement   On track    Activities and task to complete in order to accomplish goals.   Keep all upcoming appointments discussed today Continue with compliance of taking medication prescribed by Doctor Implement healthy coping skills discussed to assist with management of symptoms Review facilities from the list provided and speak with Admissions for next steps Review booklet ''When a loved one need long-term care"          SDOH assessments and interventions completed:  Yes  SDOH Interventions Today    Flowsheet Row Most Recent Value  SDOH Interventions   Housing Interventions Intervention Not Indicated  Transportation Interventions Intervention Not Indicated  Utilities Interventions Intervention Not Indicated        Care Coordination Interventions:  Yes, provided  Interventions Today    Flowsheet Row Most Recent Value  Chronic Disease   Chronic disease during today's visit Atrial Fibrillation (AFib), Hypertension (HTN), Diabetes  General Interventions   General Interventions Discussed/Reviewed Level of Care, [facility], Doctor Visits, General Interventions Discussed  Doctor Visits Discussed/Reviewed Doctor Visits Discussed  Level of Care Applications  [LTC Placement]  Applications Medicaid, [place]-2  Education Interventions   Applications Medicaid, [place]-2  Mental Health Interventions   Mental Health Discussed/Reviewed Mental Health Discussed, Coping Strategies, Anxiety  [Caregiver Stress, POA was not aware that pt's facility was neglectful  and is interested in higher level of care. Validation and encouragement provided]  Nutrition Interventions   Nutrition Discussed/Reviewed Nutrition Discussed  Pharmacy Interventions   Pharmacy Dicussed/Reviewed Pharmacy Topics Discussed, Medication Adherence  Safety Interventions   Safety Discussed/Reviewed Safety Discussed  Advanced Directive Interventions   Advanced Directives Discussed/Reviewed End of Life  End of Life Palliative  [Per POA, PCP has completed referral]       Follow up plan: Follow up call scheduled for 2-4 weeks    Encounter Outcome:  Patient Visit Completed   Jenel Lucks, MSW, LCSW [facility] Care Management [facility]  [facility] [email or web address] Phone [phone] 9:32 AM

## 2022-12-09 NOTE — Patient Instructions (Signed)
Visit Information  Thank you for taking time to visit with me today. Please don't hesitate to contact me if I can be of assistance to you.   Following are the goals we discussed today:   Goals Addressed             This Visit's Progress    Obtain Supportive Resources-Placement   On track    Activities and task to complete in order to accomplish goals.   Keep all upcoming appointments discussed today Continue with compliance of taking medication prescribed by Doctor Implement healthy coping skills discussed to assist with management of symptoms Review facilities from the list provided and speak with Admissions for next steps Review booklet ''When a loved one need long-term care"          Our next appointment is by telephone on 12/6 at 10 AM  Please call the care guide team at [phone] if you need to cancel or reschedule your appointment.   If you are experiencing a Mental Health or Behavioral Health Crisis or need someone to talk to, please call the Suicide and Crisis Lifeline: 988 call 911   Patient verbalizes understanding of instructions and care plan provided today and agrees to view in MyChart. Active MyChart status and patient understanding of how to access instructions and care plan via MyChart confirmed with patient.     Jenel Lucks, MSW, LCSW [facility] Care Management [place]  [facility] [email or web address] Phone [phone] 9:32 AM

## 2022-12-10 NOTE — Telephone Encounter (Signed)
Jenel Lucks D, LCSW  You14 hours ago (6:04 PM)    Thank you! I sent resources via email today, 12/09/22, for review    You're welcome.

## 2022-12-26 ENCOUNTER — Encounter: Payer: Self-pay | Admitting: Nurse Practitioner

## 2022-12-26 ENCOUNTER — Ambulatory Visit: Payer: Self-pay | Admitting: Licensed Clinical Social Worker

## 2022-12-26 NOTE — Patient Instructions (Signed)
Visit Information  Thank you for taking time to visit with me today. Please don't hesitate to contact me if I can be of assistance to you.   Following are the goals we discussed today:   Goals Addressed             This Visit's Progress    Obtain Supportive Resources-Placement   On track    Activities and task to complete in order to accomplish goals.   Keep all upcoming appointments discussed today Continue with compliance of taking medication prescribed by Doctor Implement healthy coping skills discussed to assist with management of symptoms Review facilities from the list provided and speak with Admissions for next steps Review booklet ''When a loved one need long-term care"  Continue to follow up with DSS CM, Marlana Latus for continued support Follow up with PCP regarding medication frequency and request for a FL2         Our next appointment is by telephone on 12/27 at 11 AM  Please call the care guide team at [phone] if you need to cancel or reschedule your appointment.   If you are experiencing a Mental Health or Behavioral Health Crisis or need someone to talk to, please call the Suicide and Crisis Lifeline: 988 call 911   Patient verbalizes understanding of instructions and care plan provided today and agrees to view in MyChart. Active MyChart status and patient understanding of how to access instructions and care plan via MyChart confirmed with patient.     Jenel Lucks, MSW, LCSW [facility] Care Management [place]  [facility] [email or web address] Phone [phone] 11:25 AM

## 2022-12-26 NOTE — Patient Outreach (Signed)
  Care Coordination   Follow Up Visit Note   12/26/2022 Name: Gerald [name] MRN: [id number] DOB: 10-12-1925  Gerald [name] is a 89 y.o. year old male who sees Eubanks, Janene Harvey, NP for primary care. I spoke with  Gerald [name]'s granddaughter by phone today.  What matters to the patients health and wellness today?  Level of Care/Placement    Goals Addressed             This Visit's Progress    Obtain Supportive Resources-Placement   On track    Activities and task to complete in order to accomplish goals.   Keep all upcoming appointments discussed today Continue with compliance of taking medication prescribed by Doctor Implement healthy coping skills discussed to assist with management of symptoms Review facilities from the list provided and speak with Admissions for next steps Review booklet ''When a loved one need long-term care"  Continue to follow up with DSS CM, Gerald [name] for continued support Follow up with PCP regarding medication frequency and request for a FL2         SDOH assessments and interventions completed:  No     Care Coordination Interventions:  Yes, provided  Interventions Today    Flowsheet Row Most Recent Value  Chronic Disease   Chronic disease during today's visit Atrial Fibrillation (AFib), Hypertension (HTN), Diabetes  General Interventions   General Interventions Discussed/Reviewed General Interventions Reviewed, Doctor Visits, Level of Care  [Pt's granddaughter will call PCP to inquire about full care nursing vs assisted living and will get FL2 and med frequency question. Patient is at [facility] and informed that the med management fee has risen.]  Doctor Visits Discussed/Reviewed Doctor Visits Reviewed  Level of Care Assisted Living, Skilled Nursing Facility  Gerald [name] from APS contacted pt's granddaughter 12/25/22. Provided supportive resources and facility recommendations. Provided consent to check in on pt at [facility]]   Applications FL-2  Education Interventions   Applications FL-2  Mental Health Interventions   Mental Health Discussed/Reviewed Mental Health Reviewed  Pharmacy Interventions   Pharmacy Dicussed/Reviewed Pharmacy Topics Reviewed, Medication Adherence  Safety Interventions   Safety Discussed/Reviewed Safety Reviewed  Advanced Directive Interventions   Advanced Directives Discussed/Reviewed End of Life  End of Life Palliative  [facility] Care Nurse visited pt at facility with pt's granddaughter last month for an initial evaluation]       Follow up plan: Follow up call scheduled for 3-6 weeks    Encounter Outcome:  Patient Visit Completed   Gerald [name], MSW, Gerald [name] Care Management [facility]  [facility] [email or web address] Phone [phone] 11:23 AM

## 2023-01-16 ENCOUNTER — Encounter: Payer: Self-pay | Admitting: Licensed Clinical Social Worker

## 2023-01-19 ENCOUNTER — Telehealth: Payer: Self-pay | Admitting: Licensed Clinical Social Worker

## 2023-01-19 NOTE — Patient Outreach (Signed)
  Care Coordination   01/16/2023 Name: Gerald [name] MRN: [id number] DOB: 1925/06/14   Care Coordination Outreach Attempts:  An unsuccessful outreach was attempted for an appointment today.  Follow Up Plan:  Additional outreach attempts will be made to offer the patient complex care management information and services.   Encounter Outcome:  No Answer   Care Coordination Interventions:  No, not indicated    Jenel Lucks, MSW, LCSW [facility] Care Management [place]  [facility] [email or web address] Phone [phone] 4:30 PM

## 2023-01-25 ENCOUNTER — Emergency Department (HOSPITAL_COMMUNITY)
Admission: EM | Admit: 2023-01-25 | Discharge: 2023-01-25 | Disposition: A | Discharge status: Home / Self Care | Payer: Medicare Other | Attending: Emergency Medicine | Admitting: Emergency Medicine

## 2023-01-25 ENCOUNTER — Emergency Department (HOSPITAL_COMMUNITY): Payer: Medicare Other

## 2023-01-25 DIAGNOSIS — Z794 Long term (current) use of insulin: Secondary | ICD-10-CM | POA: Diagnosis not present

## 2023-01-25 DIAGNOSIS — S0990XA Unspecified injury of head, initial encounter: Secondary | ICD-10-CM

## 2023-01-25 DIAGNOSIS — I10 Essential (primary) hypertension: Secondary | ICD-10-CM | POA: Diagnosis not present

## 2023-01-25 DIAGNOSIS — W19XXXA Unspecified fall, initial encounter: Secondary | ICD-10-CM | POA: Insufficient documentation

## 2023-01-25 DIAGNOSIS — S63501A Unspecified sprain of right wrist, initial encounter: Secondary | ICD-10-CM | POA: Insufficient documentation

## 2023-01-25 DIAGNOSIS — E119 Type 2 diabetes mellitus without complications: Secondary | ICD-10-CM | POA: Diagnosis not present

## 2023-01-25 DIAGNOSIS — Z7901 Long term (current) use of anticoagulants: Secondary | ICD-10-CM | POA: Diagnosis not present

## 2023-01-25 DIAGNOSIS — S0003XA Contusion of scalp, initial encounter: Secondary | ICD-10-CM | POA: Diagnosis not present

## 2023-01-25 DIAGNOSIS — S6991XA Unspecified injury of right wrist, hand and finger(s), initial encounter: Secondary | ICD-10-CM | POA: Diagnosis present

## 2023-01-25 DIAGNOSIS — Z79899 Other long term (current) drug therapy: Secondary | ICD-10-CM | POA: Diagnosis not present

## 2023-01-25 NOTE — Discharge Instructions (Addendum)
 Nothing was broken or otherwise abnormal on your x-rays and CTs today.  You can use the wrist brace for comfort, but if it starts to feel better you do not need to wear the wrist brace as there was no fracture.  I suspect either a mild sprain or arthritis versus gout flare. Please use Tylenol  or ibuprofen for pain.  You may use 600 mg ibuprofen every 6 hours or 1000 mg of Tylenol  every 6 hours.  You may choose to alternate between the 2.  This would be most effective.  Not to exceed 4 g of Tylenol  within 24 hours.  Not to exceed 3200 mg ibuprofen 24 hours.

## 2023-01-25 NOTE — ED Notes (Signed)
 Called to give report to SNF, and they advised there was no nurse there to give report to. They advised to write it on AVS for facility staff. Done, patient leaving by PTAR.

## 2023-01-25 NOTE — ED Triage Notes (Addendum)
 Pt BIB EMS from [facility] for fall on 01/24/23 on eliquis . PT is very HOH. Pt A&Ox4, denies LOC and endorses hitting head on ground. Pt reports right wrist pain and presents with lac to top of head. Hx afib.   EMS VS 108/68 40-50 HR- irregular and weak 106 BG 98 % RA

## 2023-01-25 NOTE — ED Provider Notes (Signed)
 [place] EMERGENCY DEPARTMENT AT [facility] Provider Note   CSN: [id number] Arrival date & time: 01/25/23  9092     History  Chief Complaint  Patient presents with   Fall    On 01/24/23 on thinner    Gerald [name] is a 89 y.o. male past medical history significant for diabetes, hyperlipidemia, hypertension who is on Eliquis  secondary to paroxysmal A-fib who presents from [facility] for fall yesterday morning on Eliquis .  He denies loss of consciousness and endorses hitting head on ground.  Endorses some right wrist pain and has a small abrasion to the top of the head.  He is not sure why he fell, denies feeling lightheaded or having any chest pain.   Fall       Home Medications Prior to Admission medications   Medication Sig Start Date End Date Taking? Authorizing Provider  Bismuth Subsalicylate (PEPTO BISMOL PO) Take by mouth as needed.    [provider]  Blood Glucose Monitoring Suppl (ONE TOUCH ULTRA 2) W/DEVICE KIT Test blood sugar daily. Dx Code: 250.72 01/18/13   Tish Elsie FALCON, MD  diltiazem  (CARDIZEM  CD) 120 MG 24 hr capsule Take 1 capsule (120 mg total) by mouth daily. 12/31/18   Jillian Buttery, MD  ELIQUIS  2.5 MG TABS tablet Take 1 tablet (2.5 mg total) by mouth 2 (two) times daily. Start on 12/31/18 12/30/18   Jillian Buttery, MD  FEROSUL 325 (65 Fe) MG tablet Take 325 mg by mouth daily. 10/21/22   [provider]  Insulin  Glargine (BASAGLAR  KWIKPEN) 100 UNIT/ML Inject 10 Units into the skin at bedtime. 10/28/22   [provider]  Insulin  Pen Needle (B-D UF III MINI PEN NEEDLES) 31G X 5 MM MISC USE TO TEST BLOOD SUGARS ICD 10 E11 59 03/13/14   Tish Elsie FALCON, MD  levothyroxine  (SYNTHROID ) 50 MCG tablet Take 50 mcg by mouth every morning. 10/21/22   [provider]  lisinopril  (ZESTRIL ) 5 MG tablet Take 5 mg by mouth daily. 11/01/22   [provider]  melatonin 1 MG TABS tablet Take 1 mg by mouth at  bedtime.    [provider]  metoprolol  succinate (TOPROL -XL) 25 MG 24 hr tablet Take 25 mg by mouth daily. 12/14/18   [provider]  ONE TOUCH ULTRA TEST test strip CHECK BLOOD GLUCOSE (SUGAR) DAILY 02/15/14   Tish Elsie FALCON, MD  [facility] DELICA LANCETS 33G MISC CHECK BLOOD GLUCOSE DAILY E11  59 02/24/14   Tish Elsie FALCON, MD  pantoprazole  (PROTONIX ) 40 MG tablet Take 1 tablet (40 mg total) by mouth 2 (two) times daily. 12/30/18   Jillian Buttery, MD  pravastatin  (PRAVACHOL ) 10 MG tablet Take 10 mg by mouth daily. 12/23/18   [provider]  sertraline  (ZOLOFT ) 50 MG tablet Take 50 mg by mouth daily. 12/21/18   [provider]  terbinafine  (LAMISIL ) 250 MG tablet Take 1 tablet (250 mg total) by mouth daily. 11/09/22 05/08/23  Shalhoub, Zachary PARAS, MD      Allergies    Patient has no known allergies.    Review of Systems   Review of Systems  All other systems reviewed and are negative.   Physical Exam Updated Vital Signs BP 115/65   Pulse 61   Resp 16   Ht 5' 6 (1.676 m)   Wt 66.2 kg   SpO2 100%   BMI 23.56 kg/m  Physical Exam Vitals and nursing note reviewed.  Constitutional:  General: He is not in acute distress.    Appearance: Normal appearance.  HENT:     Head: Normocephalic and atraumatic.  Eyes:     General:        Right eye: No discharge.        Left eye: No discharge.  Cardiovascular:     Rate and Rhythm: Normal rate and regular rhythm.     Pulses: Normal pulses.     Heart sounds: No murmur heard.    No friction rub. No gallop.  Pulmonary:     Effort: Pulmonary effort is normal.     Breath sounds: Normal breath sounds.  Abdominal:     General: Bowel sounds are normal.     Palpations: Abdomen is soft.  Musculoskeletal:     Comments: Some bruising, soft tissue swelling noted to the right wrist without obvious step-off, deformity.  Skin:    General: Skin is warm and dry.     Capillary Refill: Capillary refill takes  less than 2 seconds.     Comments: Small abrasion noted with some bruising at the right frontal scalp, no active bleeding at this time  Neurological:     Mental Status: He is alert and oriented to person, place, and time.  Psychiatric:        Mood and Affect: Mood normal.        Behavior: Behavior normal.     ED Results / Procedures / Treatments   Labs (all labs ordered are listed, but only abnormal results are displayed) Labs Reviewed - No data to display  EKG EKG Interpretation Date/Time:  Sunday January 25 2023 09:30:50 EST Ventricular Rate:  87 PR Interval:  162 QRS Duration:  81 QT Interval:  393 QTC Calculation: 473 R Axis:   13  Text Interpretation: Sinus rhythm Abnormal R-wave progression, early transition Confirmed by Dean Clarity [phone]) on 01/25/2023 9:33:21 AM  Radiology CT Head Wo Contrast Result Date: 01/25/2023 CLINICAL DATA:  Head trauma, minor (Age >= 65y); Neck trauma (Age >= 65y). Fall on Eliquis . Head laceration. EXAM: CT HEAD WITHOUT CONTRAST CT CERVICAL SPINE WITHOUT CONTRAST TECHNIQUE: Multidetector CT imaging of the head and cervical spine was performed following the standard protocol without intravenous contrast. Multiplanar CT image reconstructions of the cervical spine were also generated. RADIATION DOSE REDUCTION: This exam was performed according to the departmental dose-optimization program which includes automated exposure control, adjustment of the mA and/or kV according to patient size and/or use of iterative reconstruction technique. COMPARISON:  None Available. FINDINGS: CT HEAD FINDINGS Brain: There is no evidence of an acute infarct, intracranial hemorrhage, mass, midline shift, or extra-axial fluid collection. There is mild generalized cerebral atrophy. Cerebral white matter hypodensities are nonspecific but compatible with mild chronic small vessel ischemic disease. Vascular: Calcified atherosclerosis at the skull base. No hyperdense vessel. Skull:  No acute fracture or suspicious osseous lesion. Sinuses/Orbits: The included paranasal sinuses and mastoid air cells are well aerated. Bilateral cataract extraction. Other: None. CT CERVICAL SPINE FINDINGS Alignment: Straightening of the normal cervical lordosis. Left convex cervical spine curvature. Grade 1 anterolisthesis of C2 on C3 and C4 on C5. Trace retrolisthesis of C5 on C6 and C6 on C7. Skull base and vertebrae: No acute fracture or suspicious osseous lesion. Soft tissues and spinal canal: No prevertebral fluid or swelling. No visible canal hematoma. Disc levels: Widespread cervical disc degeneration, with disc space narrowing being most severe at C3-4, C5-6, and C6-7. Severe right facet arthrosis bilaterally at C2-3 and on the  right at C3-4 and C4-5. Prominent posterior endplate spurring at C5-6 resulting in moderate spinal stenosis. Moderately advanced multilevel neural foraminal stenosis. Upper chest: Clear lung apices.  Aortic atherosclerosis. Other: Asymmetric atherosclerotic calcification at the left carotid bifurcation. IMPRESSION: 1. No evidence of acute intracranial abnormality or acute cervical spine fracture. 2. Mild chronic small vessel ischemic disease. 3. Advanced cervical disc and facet degeneration. 4.  Aortic Atherosclerosis (ICD10-I70.0). Electronically Signed   By: Dasie Hamburg M.D.   On: 01/25/2023 10:14   CT Cervical Spine Wo Contrast Result Date: 01/25/2023 CLINICAL DATA:  Head trauma, minor (Age >= 65y); Neck trauma (Age >= 65y). Fall on Eliquis . Head laceration. EXAM: CT HEAD WITHOUT CONTRAST CT CERVICAL SPINE WITHOUT CONTRAST TECHNIQUE: Multidetector CT imaging of the head and cervical spine was performed following the standard protocol without intravenous contrast. Multiplanar CT image reconstructions of the cervical spine were also generated. RADIATION DOSE REDUCTION: This exam was performed according to the departmental dose-optimization program which includes automated  exposure control, adjustment of the mA and/or kV according to patient size and/or use of iterative reconstruction technique. COMPARISON:  None Available. FINDINGS: CT HEAD FINDINGS Brain: There is no evidence of an acute infarct, intracranial hemorrhage, mass, midline shift, or extra-axial fluid collection. There is mild generalized cerebral atrophy. Cerebral white matter hypodensities are nonspecific but compatible with mild chronic small vessel ischemic disease. Vascular: Calcified atherosclerosis at the skull base. No hyperdense vessel. Skull: No acute fracture or suspicious osseous lesion. Sinuses/Orbits: The included paranasal sinuses and mastoid air cells are well aerated. Bilateral cataract extraction. Other: None. CT CERVICAL SPINE FINDINGS Alignment: Straightening of the normal cervical lordosis. Left convex cervical spine curvature. Grade 1 anterolisthesis of C2 on C3 and C4 on C5. Trace retrolisthesis of C5 on C6 and C6 on C7. Skull base and vertebrae: No acute fracture or suspicious osseous lesion. Soft tissues and spinal canal: No prevertebral fluid or swelling. No visible canal hematoma. Disc levels: Widespread cervical disc degeneration, with disc space narrowing being most severe at C3-4, C5-6, and C6-7. Severe right facet arthrosis bilaterally at C2-3 and on the right at C3-4 and C4-5. Prominent posterior endplate spurring at C5-6 resulting in moderate spinal stenosis. Moderately advanced multilevel neural foraminal stenosis. Upper chest: Clear lung apices.  Aortic atherosclerosis. Other: Asymmetric atherosclerotic calcification at the left carotid bifurcation. IMPRESSION: 1. No evidence of acute intracranial abnormality or acute cervical spine fracture. 2. Mild chronic small vessel ischemic disease. 3. Advanced cervical disc and facet degeneration. 4.  Aortic Atherosclerosis (ICD10-I70.0). Electronically Signed   By: Dasie Hamburg M.D.   On: 01/25/2023 10:14   DG Wrist Complete Right Result Date:  01/25/2023 CLINICAL DATA:  [place].  Right wrist pain. EXAM: RIGHT WRIST - COMPLETE 3+ VIEW COMPARISON:  None Available. FINDINGS: The joint spaces are maintained. Minimal degenerative changes for age. No acute fracture is identified. Vascular calcifications are noted. IMPRESSION: No acute fracture. Electronically Signed   By: MYRTIS Stammer M.D.   On: 01/25/2023 09:48   DG Chest Portable 1 View Result Date: 01/25/2023 CLINICAL DATA:  89 year old male with history of trauma from a fall. EXAM: PORTABLE CHEST 1 VIEW COMPARISON:  Chest x-ray 12/26/2018. FINDINGS: Lung volumes are very low. Low bibasilar opacities favored to reflect subsegmental atelectasis (underlying airspace consolidation is not excluded). No definite pleural effusions. No pneumothorax. No evidence of pulmonary edema. Large hiatal hernia. Heart size is normal. Upper mediastinal contours are within normal limits. Atherosclerotic calcifications in the thoracic aorta. Status post median sternotomy  for CABG. IMPRESSION: 1. Low lung volumes with probable bibasilar subsegmental atelectasis. 2. Large hiatal hernia. 3. Aortic atherosclerosis. Electronically Signed   By: Toribio Aye M.D.   On: 01/25/2023 09:48    Procedures Procedures    Medications Ordered in ED Medications - No data to display  ED Course/ Medical Decision Making/ A&P                                 Medical Decision Making Amount and/or Complexity of Data Reviewed Radiology: ordered.  This patient is a 89 y.o. male  who presents to the ED for concern of fall, head injury, wrist pain.   Differential diagnoses prior to evaluation: The emergent differential diagnosis includes, but is not limited to,  epidural hematoma, subdural hematoma, skull fracture, subarachnoid hemorrhage, unstable cervical spine fracture, concussion vs other MSK injury  . This is not an exhaustive differential.   Past Medical History / Co-morbidities / Social History: Hypertension, diabetes,  anemia, hard of hearing  Physical Exam: Physical exam performed. The pertinent findings include: Small abrasion noted with some bruising at the right frontal scalp, no active bleeding at this time   Some bruising, soft tissue swelling noted to the right wrist without obvious step-off, deformity.   Moving all 4 limbs spontaneously with intact strength throughout.  Lab Tests/Imaging studies: I personally interpreted labs/imaging and the pertinent results include: I independently interpreted plain film radiograph of the right wrist, portable chest x-ray, CT head without contrast and CT C-spine without contrast.  Imaging with no acute fracture, dislocation, or intracranial injury.  I agree with the radiologist interpretation.  Cardiac monitoring: EKG obtained and interpreted by myself and attending physician which shows: Normal sinus rhythm   Medications: Encouraged ibuprofen, Tylenol , wrist brace, no other recommendations at this time, he has overall reassuring injuries after fall, no evidence of fracture, dislocation, intracranial bleed.   Disposition: After consideration of the diagnostic results and the patients response to treatment, I feel that patient is able for discharge with plan as above.   emergency department workup does not suggest an emergent condition requiring admission or immediate intervention beyond what has been performed at this time. The plan is: as above. The patient is safe for discharge and has been instructed to return immediately for worsening symptoms, change in symptoms or any other concerns.  Final Clinical Impression(s) / ED Diagnoses Final diagnoses:  Wrist sprain, right, initial encounter  Fall, initial encounter  Injury of head, initial encounter    Rx / DC Orders ED Discharge Orders     None         Rosan Sherlean VEAR DEVONNA 01/25/23 1033    Dean Clarity, MD 01/25/23 1041

## 2023-01-25 NOTE — ED Notes (Signed)
 Ortho tech notified.

## 2023-01-26 ENCOUNTER — Telehealth: Payer: Self-pay

## 2023-01-26 NOTE — Transitions of Care (Post Inpatient/ED Visit) (Signed)
   01/26/2023  Name: Gerald [name] MRN: [id number] DOB: Mar 25, 1925  Today's TOC FU Call Status: Today's TOC FU Call Status:: Unsuccessful Call (1st Attempt) Unsuccessful Call (1st Attempt) Date: 01/26/23  Attempted to reach the patient regarding the most recent Inpatient/ED visit. Called patient and no answer. Voicemail was left with office call back number.    Follow Up Plan: Additional outreach attempts will be made to reach the patient to complete the Transitions of Care (Post Inpatient/ED visit) call.   Signature : Andreah Goheen.D/RMA

## 2023-01-27 NOTE — Transitions of Care (Post Inpatient/ED Visit) (Signed)
 01/27/2023  Name: Gerald [name] MRN: [id number] DOB: May 30, 1925  Today's TOC FU Call Status: Today's TOC FU Call Status:: Successful TOC FU Call Completed Unsuccessful Call (1st Attempt) Date: 01/26/23 [facility] FU Call Complete Date: 01/27/23 Patient's Name and Date of Birth confirmed.  Transition Care Management Follow-up Telephone Call Date of Discharge: 01/25/23 Discharge Facility: [facility]) Type of Discharge: Emergency Department Reason for ED Visit: Other: (Fall) How have you been since you were released from the hospital?: Same Any questions or concerns?: Yes Patient Questions/Concerns:: Patient will save questions for appointment  Items Reviewed: Did you receive and understand the discharge instructions provided?: Yes Medications obtained,verified, and reconciled?: Yes (Medications Reviewed) Any new allergies since your discharge?: No Dietary orders reviewed?: No Do you have support at home?: Yes Name of Support/Comfort Primary Source: patient lives in facility  Medications Reviewed Today: Medications Reviewed Today     Reviewed by Shavar Gorka E, CMA (Certified Medical Assistant) on 01/27/23 at 1701  Med List Status: <None>   Medication Order Taking? Sig Documenting Provider Last Dose Status Informant  Bismuth Subsalicylate (PEPTO BISMOL PO) [id number] Yes Take by mouth as needed. [provider] Taking Active   Blood Glucose Monitoring Suppl (ONE TOUCH ULTRA 2) W/DEVICE KIT [id number] Yes Test blood sugar daily. Dx Code: 749.27 Tish Elsie FALCON, MD Taking Active Multiple Informants, Pharmacy Records, Nursing Home Medication Administration Guide (MAG)  diltiazem  (CARDIZEM  CD) 120 MG 24 hr capsule [id number] Yes Take 1 capsule (120 mg total) by mouth daily. Jillian Buttery, MD Taking Active Multiple Informants, Pharmacy Records, Nursing Home Medication Administration Guide (MAG)  ELIQUIS  2.5 MG TABS tablet [id number] Yes Take 1 tablet (2.5 mg total) by mouth 2  (two) times daily. Start on 12/31/18 Jillian Buttery, MD Taking Active Multiple Informants, Pharmacy Records, Nursing Home Medication Administration Guide (MAG)  FEROSUL 325 (65 Fe) MG tablet [id number] Yes Take 325 mg by mouth daily. [provider] Taking Active Multiple Informants, Pharmacy Records, Nursing Home Medication Administration Guide (MAG)  Insulin  Glargine (BASAGLAR  KWIKPEN) 100 UNIT/ML [id number] Yes Inject 10 Units into the skin at bedtime. [provider] Taking Active Multiple Informants, Pharmacy Records, Nursing Home Medication Administration Guide (MAG)  Insulin  Pen Needle (B-D UF III MINI PEN NEEDLES) 31G X 5 MM MISC [id number] Yes USE TO TEST BLOOD SUGARS ICD 10 E11 59 Tish Elsie FALCON, MD Taking Active Multiple Informants, Pharmacy Records, Nursing Home Medication Administration Guide (MAG)  levothyroxine  (SYNTHROID ) 50 MCG tablet [id number] Yes Take 50 mcg by mouth every morning. [provider] Taking Active Multiple Informants, Pharmacy Records, Nursing Home Medication Administration Guide (MAG)  lisinopril  (ZESTRIL ) 5 MG tablet [id number] Yes Take 5 mg by mouth daily. [provider] Taking Active Multiple Informants, Pharmacy Records, Nursing Home Medication Administration Guide (MAG)  melatonin 1 MG TABS tablet [id number] Yes Take 1 mg by mouth at bedtime. [provider] Taking Active   metoprolol  succinate (TOPROL -XL) 25 MG 24 hr tablet [id number] Yes Take 25 mg by mouth daily. [provider] Taking Active Multiple Informants, Pharmacy Records, Nursing Home Medication Administration Guide (MAG)  ONE TOUCH ULTRA TEST test strip [id number] Yes CHECK BLOOD GLUCOSE (SUGAR) DAILY Tish Elsie FALCON, MD Taking Active Multiple Informants, Pharmacy Records, Nursing Home Medication Administration Guide (MAG)  AISHA PASTOR LANCETS 33G MISC [id number] Yes CHECK BLOOD GLUCOSE DAILY E11  59 Tish Elsie FALCON, MD Taking Active Multiple  Informants, Pharmacy Records, Nursing Home Medication Administration Guide (MAG)  pantoprazole  (PROTONIX ) 40 MG tablet [id number]  Yes Take 1 tablet (40 mg total) by mouth 2 (two) times daily. Jillian Buttery, MD Taking Active Multiple Informants, Pharmacy Records, Nursing Home Medication Administration Guide (MAG)  pravastatin  (PRAVACHOL ) 10 MG tablet [id number] Yes Take 10 mg by mouth daily. [provider] Taking Active Multiple Informants, Pharmacy Records, Nursing Home Medication Administration Guide (MAG)  sertraline  (ZOLOFT ) 50 MG tablet [id number] Yes Take 50 mg by mouth daily. [provider] Taking Active Multiple Informants, Pharmacy Records, Nursing Home Medication Administration Guide (MAG)  terbinafine  (LAMISIL ) 250 MG tablet [id number] Yes Take 1 tablet (250 mg total) by mouth daily. Shalhoub, Zachary PARAS, MD Taking Active   Med List Note Alona, Freddie HERO, CPhT 12/26/18 1414): [facility] [facility] [employer]: [phone]            Home Care and Equipment/Supplies: Were Home Health Services Ordered?: No Any new equipment or medical supplies ordered?: No  Functional Questionnaire: Do you need assistance with bathing/showering or dressing?: Yes Do you need assistance with meal preparation?: Yes Do you need assistance with eating?: Yes Do you have difficulty maintaining continence: Yes Do you need assistance with getting out of bed/getting out of a chair/moving?: Yes Do you have difficulty managing or taking your medications?: Yes  Follow up appointments reviewed: PCP Follow-up appointment confirmed?: Yes Date of PCP follow-up appointment?: 02/09/23 Follow-up Provider: Harlene An, NP [facility] Follow-up appointment confirmed?: No Do you need transportation to your follow-up appointment?: No Do you understand care options if your condition(s) worsen?: Yes-patient verbalized understanding    SIGNATURE: Leanard Dimaio.D/RMA

## 2023-02-09 ENCOUNTER — Encounter: Payer: Federal, State, Local not specified - PPO | Admitting: Nurse Practitioner

## 2023-02-13 ENCOUNTER — Encounter: Payer: Self-pay | Admitting: Nurse Practitioner

## 2023-02-13 ENCOUNTER — Ambulatory Visit (INDEPENDENT_AMBULATORY_CARE_PROVIDER_SITE_OTHER): Payer: Medicare Other | Admitting: Nurse Practitioner

## 2023-02-13 VITALS — BP 108/62 | HR 60 | Temp 97.5°F | Resp 16 | Ht 66.0 in | Wt 149.0 lb

## 2023-02-13 DIAGNOSIS — Z794 Long term (current) use of insulin: Secondary | ICD-10-CM

## 2023-02-13 DIAGNOSIS — E118 Type 2 diabetes mellitus with unspecified complications: Secondary | ICD-10-CM

## 2023-02-13 DIAGNOSIS — Z111 Encounter for screening for respiratory tuberculosis: Secondary | ICD-10-CM

## 2023-02-13 DIAGNOSIS — I480 Paroxysmal atrial fibrillation: Secondary | ICD-10-CM | POA: Diagnosis not present

## 2023-02-13 DIAGNOSIS — F03C0 Unspecified dementia, severe, without behavioral disturbance, psychotic disturbance, mood disturbance, and anxiety: Secondary | ICD-10-CM

## 2023-02-13 DIAGNOSIS — E785 Hyperlipidemia, unspecified: Secondary | ICD-10-CM

## 2023-02-13 DIAGNOSIS — H3530 Unspecified macular degeneration: Secondary | ICD-10-CM

## 2023-02-13 DIAGNOSIS — E039 Hypothyroidism, unspecified: Secondary | ICD-10-CM

## 2023-02-13 DIAGNOSIS — I10 Essential (primary) hypertension: Secondary | ICD-10-CM

## 2023-02-13 NOTE — Progress Notes (Signed)
Careteam: Patient Care Team: [name], NP as PCP - General (Geriatric Medicine)  PLACE OF SERVICE:  [facility]  Advanced Directive information Does Patient Have a Medical Advance Directive?: Yes, Type of Advance Directive: Healthcare Power of [name];Living will, Does patient want to make changes to medical advance directive?: No - Patient declined  No Known Allergies  Chief Complaint  Patient presents with   Transitions Of Care    Hospital follow up. Patient had a recent memory screening at [facility] unit      HPI: Patient is a 89 y.o. male for follow up.  He is here today with granddaughter.  He is very HOH- hearing aide to left ear is broken and they are getting this repaired.  He had a fall in early January and went to the ED for evaluation. No pain at this time.   He is going to going to [facility].  Plan is to move him in next week. He needs 24/7 care and they are able to provide this with assistance in ADLs Able to help with care and 7 mins from his granddaughter.  Granddaughter had a very hard time getting him here today so likely will see the doctors that go to the memory care to see him.   Toe on right foot has heeled, they had wound care coming to facility but his has stopped since area has heeled up.   Review of Systems:  Review of Systems  Constitutional:  Negative for chills, fever and weight loss.  HENT:  Positive for hearing loss. Negative for tinnitus.   Respiratory:  Negative for cough, sputum production and shortness of breath.   Cardiovascular:  Negative for chest pain, palpitations and leg swelling.  Gastrointestinal:  Negative for abdominal pain, constipation, diarrhea and heartburn.  Genitourinary:  Negative for dysuria, frequency and urgency.  Musculoskeletal:  Positive for falls. Negative for back pain, joint pain and myalgias.  Skin: Negative.   Neurological:  Negative for dizziness and headaches.   Psychiatric/Behavioral:  Positive for memory loss. Negative for depression. The patient does not have insomnia.     Past Medical History:  Diagnosis Date   Anemia    Diabetes mellitus    GI problem    Hard of hearing    Hypertension    Macular degeneration    Dr Ashley Royalty   Open wound of left foot excluding one or more toes    Stricture    PMH OF   Thyroid condition    Past Surgical History:  Procedure Laterality Date   CATARACT EXTRACTION     OD   COLONOSCOPY W/ POLYPECTOMY  2009   CORONARY ARTERY BYPASS GRAFT     ESOPHAGEAL DILATION     x 1   ESOPHAGOGASTRODUODENOSCOPY (EGD) WITH PROPOFOL N/A 12/28/2018   Procedure: ESOPHAGOGASTRODUODENOSCOPY (EGD) WITH PROPOFOL;  Surgeon: Lemar Lofty., MD;  Location: [facility];  Service: Gastroenterology;  Laterality: N/A;   FOREIGN BODY REMOVAL  12/28/2018   Procedure: FOREIGN BODY REMOVAL;  Surgeon: Meridee Score Netty Starring., MD;  Location: [facility];  Service: Gastroenterology;;   INGUINAL HERNIA REPAIR  1998   Bilateral   LUMBAR LAMINECTOMY  1996   Macular Degeneration Surgery     Social History:   reports that he quit smoking about 35 years ago. His smoking use included cigarettes. He has never used smokeless tobacco. He reports that he does not drink alcohol and does not use drugs.  Family History  Problem  Relation Age of Onset   Hypertension Mother    Coronary artery disease Mother    Stroke Mother 15       ?   Heart attack Mother 60       ?   Arthritis Father    Colon cancer Brother    Diabetes Maternal Grandmother    Diabetes Paternal Grandmother     Medications: Patient's Medications  New Prescriptions   No medications on file  Previous Medications   BISMUTH SUBSALICYLATE (PEPTO BISMOL PO)    Take by mouth as needed.   BLOOD GLUCOSE MONITORING SUPPL (ONE TOUCH ULTRA 2) W/DEVICE KIT    Test blood sugar daily. Dx Code: 250.72   DILTIAZEM (CARDIZEM CD) 120 MG 24 HR CAPSULE    Take 1 capsule (120 mg  total) by mouth daily.   ELIQUIS 2.5 MG TABS TABLET    Take 1 tablet (2.5 mg total) by mouth 2 (two) times daily. Start on 12/31/18   FEROSUL 325 (65 FE) MG TABLET    Take 325 mg by mouth daily.   INSULIN GLARGINE (BASAGLAR KWIKPEN) 100 UNIT/ML    Inject 10 Units into the skin at bedtime.   INSULIN PEN NEEDLE (B-D UF III MINI PEN NEEDLES) 31G X 5 MM MISC    USE TO TEST BLOOD SUGARS ICD 10 E11 59   LEVOTHYROXINE (SYNTHROID) 50 MCG TABLET    Take 50 mcg by mouth every morning.   LISINOPRIL (ZESTRIL) 5 MG TABLET    Take 5 mg by mouth daily.   MELATONIN 1 MG TABS TABLET    Take 1 mg by mouth at bedtime.   METOPROLOL SUCCINATE (TOPROL-XL) 25 MG 24 HR TABLET    Take 25 mg by mouth daily.   ONE TOUCH ULTRA TEST TEST STRIP    CHECK BLOOD GLUCOSE (SUGAR) DAILY   ONETOUCH DELICA LANCETS 33G MISC    CHECK BLOOD GLUCOSE DAILY E11  59   PANTOPRAZOLE (PROTONIX) 40 MG TABLET    Take 1 tablet (40 mg total) by mouth 2 (two) times daily.   PRAVASTATIN (PRAVACHOL) 10 MG TABLET    Take 10 mg by mouth daily.   SERTRALINE (ZOLOFT) 50 MG TABLET    Take 50 mg by mouth daily.   TERBINAFINE (LAMISIL) 250 MG TABLET    Take 1 tablet (250 mg total) by mouth daily.  Modified Medications   No medications on file  Discontinued Medications   No medications on file    Physical Exam:  Vitals:   02/13/23 1118  BP: 108/62  Pulse: 60  Resp: 16  Temp: (!) 97.5 F (36.4 C)  TempSrc: Temporal  SpO2: 98%  Weight: 149 lb (67.6 kg)  Height: 5\' 6"  (1.676 m)   Body mass index is 24.05 kg/m. Wt Readings from Last 3 Encounters:  02/13/23 149 lb (67.6 kg)  01/25/23 145 lb 15.1 oz (66.2 kg)  12/03/22 146 lb (66.2 kg)    Physical Exam Constitutional:      General: He is not in acute distress.    Appearance: He is well-developed. He is not diaphoretic.  HENT:     Head: Normocephalic and atraumatic.     Right Ear: External ear normal.     Left Ear: External ear normal.     Mouth/Throat:     Pharynx: No oropharyngeal  exudate.  Eyes:     Conjunctiva/sclera: Conjunctivae normal.     Pupils: Pupils are equal, round, and reactive to light.  Cardiovascular:  Rate and Rhythm: Normal rate and regular rhythm.     Heart sounds: Normal heart sounds.  Pulmonary:     Effort: Pulmonary effort is normal.     Breath sounds: Normal breath sounds.  Abdominal:     General: Bowel sounds are normal.     Palpations: Abdomen is soft.  Musculoskeletal:        General: No tenderness.     Cervical back: Normal range of motion and neck supple.     Right lower leg: No edema.     Left lower leg: No edema.  Skin:    General: Skin is warm and dry.  Neurological:     Mental Status: He is alert and oriented to person, place, and time.     Labs reviewed: Basic Metabolic Panel: Recent Labs    11/08/22 0706 11/09/22 0556 12/03/22 1158  NA 135 134* 136  K 4.3 4.1 4.4  CL 101 104 101  CO2 25 22 28   GLUCOSE 93 85 156*  BUN 22 18 27*  CREATININE 1.19 1.18 1.26*  CALCIUM 8.0* 7.9* 8.5*  MG 2.2 2.2  --   TSH  --   --  2.47   Liver Function Tests: Recent Labs    11/07/22 0056 11/08/22 0706 11/09/22 0556 12/03/22 1158  AST 17 16 18 12   ALT 12 11 9  7*  ALKPHOS 49 51 46  --   BILITOT 0.3 0.5 0.6 0.3  PROT 6.9 6.5 6.5 6.5  ALBUMIN 3.3* 3.0* 2.9*  --    No results for input(s): "LIPASE", "AMYLASE" in the last 8760 hours. No results for input(s): "AMMONIA" in the last 8760 hours. CBC: Recent Labs    11/08/22 0706 11/09/22 0556 12/03/22 1158  WBC 5.2 5.2 5.6  NEUTROABS 3.5 3.5 3,623  HGB 10.4* 10.5* 10.5*  HCT 33.6* 34.7* 33.7*  MCV 86.8 89.2 86.6  PLT 223 240 239   Lipid Panel: No results for input(s): "CHOL", "HDL", "LDLCALC", "TRIG", "CHOLHDL", "LDLDIRECT" in the last 8760 hours. TSH: Recent Labs    12/03/22 1158  TSH 2.47   A1C: Lab Results  Component Value Date   HGBA1C 6.1 (H) 11/07/2022    Assessment/Plan 1. Paroxysmal atrial fibrillation (HCC) (Primary) -rate controlled on  diltiazem   2. Acquired hypothyroidism TSH at goal on synthroid  3. Macular degeneration of both eyes, unspecified type Needing more assistance due to visual impairment   4. Essential hypertension -Blood pressure well controlled, goal bp <140/90 Continue current medications and dietary modifications follow metabolic panel  5. Hyperlipidemia, unspecified hyperlipidemia type Continues on pravstatin  6. Type 2 diabetes mellitus with complication, with long-term current use of insulin (HCC) -continues on basaglar 10 units daily -encourage routine checks of blood sugar and to monitor for hypoglycemia.  -Encouraged dietary compliance, routine foot care/monitoring and to keep up with diabetic eye exams through ophthalmology   7. Severe dementia without behavioral disturbance, psychotic disturbance, mood disturbance, or anxiety, unspecified dementia type (HCC) Advanced dementia requiring 24/7 care. He is currently in independent living and needing more care- granddaughter has gotten him a bed at memory care closer to her.  FL2 completed today and will fax to facility when TB lab results.   8. Screening-pulmonary TB - QuantiFERON-TB Gold Plus  Ronen Bromwell K. Biagio Borg [facility] [phone]

## 2023-02-16 ENCOUNTER — Encounter: Payer: Self-pay | Admitting: Nurse Practitioner

## 2023-02-16 LAB — QUANTIFERON-TB GOLD PLUS
Mitogen-NIL: >10 [IU]/mL
NIL: 0.02 [IU]/mL
QuantiFERON-TB Gold Plus: NEGATIVE
TB1-NIL: 0.01 [IU]/mL
TB2-NIL: 0 [IU]/mL

## 2023-02-17 ENCOUNTER — Telehealth: Payer: Self-pay

## 2023-02-17 NOTE — Telephone Encounter (Signed)
Outgoing call placed to patients granddaughter to inquire if she has a fax number for [employer]. I had previously called [facility] at [phone], however no one answered the phone.  Rosey Bath gave me the fax number [phone], Attention [facility].   Paperwork was faxed (FL2, immunization record from [employer] and [employer], along with lab results (Quantiferon TB Gold). I will hold paperwork for 1 day and then send to scanning.

## 2023-03-13 ENCOUNTER — Encounter: Payer: Self-pay | Admitting: Nurse Practitioner
